# Patient Record
Sex: Male | Born: 1970 | Hispanic: Yes | Marital: Married | Smoking: Light tobacco smoker
Health system: Southern US, Community
[De-identification: ages and names within clinical notes are randomized; demographics above are authoritative.]

## PROBLEM LIST (undated history)

## (undated) DIAGNOSIS — I10 Essential (primary) hypertension: Secondary | ICD-10-CM

## (undated) DIAGNOSIS — E785 Hyperlipidemia, unspecified: Secondary | ICD-10-CM

## (undated) DIAGNOSIS — R7303 Prediabetes: Secondary | ICD-10-CM

## (undated) HISTORY — DX: Hyperlipidemia, unspecified: E78.5

## (undated) HISTORY — DX: Essential (primary) hypertension: I10

## (undated) HISTORY — DX: Prediabetes: R73.03

---

## 2015-07-17 ENCOUNTER — Encounter: Payer: Self-pay | Admitting: Internal Medicine

## 2015-07-17 ENCOUNTER — Ambulatory Visit (INDEPENDENT_AMBULATORY_CARE_PROVIDER_SITE_OTHER): Payer: Self-pay | Admitting: Internal Medicine

## 2015-07-17 VITALS — BP 168/102 | HR 87 | Temp 98.8°F | Resp 22 | Ht 69.0 in | Wt 232.0 lb

## 2015-07-17 DIAGNOSIS — E785 Hyperlipidemia, unspecified: Secondary | ICD-10-CM | POA: Insufficient documentation

## 2015-07-17 DIAGNOSIS — L089 Local infection of the skin and subcutaneous tissue, unspecified: Secondary | ICD-10-CM

## 2015-07-17 DIAGNOSIS — I10 Essential (primary) hypertension: Secondary | ICD-10-CM

## 2015-07-17 DIAGNOSIS — Z23 Encounter for immunization: Secondary | ICD-10-CM

## 2015-07-17 DIAGNOSIS — S61259A Open bite of unspecified finger without damage to nail, initial encounter: Secondary | ICD-10-CM

## 2015-07-17 DIAGNOSIS — W5501XA Bitten by cat, initial encounter: Secondary | ICD-10-CM

## 2015-07-17 MED ORDER — METOPROLOL TARTRATE 25 MG PO TABS: 25.0000 mg | ORAL_TABLET | Freq: Two times a day (BID) | ORAL | Status: DC

## 2015-07-17 MED ORDER — AMOXICILLIN-POT CLAVULANATE 875-125 MG PO TABS: 1.0000 | ORAL_TABLET | Freq: Two times a day (BID) | ORAL | Status: DC

## 2015-07-17 NOTE — Progress Notes (Addendum)
Subjective:    Patient ID: [facility] Todd, male    DOB: 1970/02/22, 45 y.o.   MRN: [id number]  HPI  New patient to establish His wife is Jerrye BeaversYolanda Tapia, a patient here.  1.  Cat bite to left hand:  States occurred 4 days ago.  Pt. Was walking into the back yard and startled a stray cat.  The cat was sitting on a lawn chair and tried to take off, but hind leg got caught in the chair.  The patient went to pull him out and the cat scratched him in the web between his thumb and index finger and bit him at the base of his left index finger. The cat is a stray around the [place] neighborhood and appears healthy.   He has not noted any frothing at the mouth previously.  They have not seen the cat around since the incident. About 4 hours after the bite, the pt. Started to note swelling.  STarted an antibiotic ointment he purchased at a tienda in [place] that contained Terramycin and Polymixin B.  The ointment has not made any difference,though he doesn't feel the swelling has worsened, though he has had worsening pain.  Has only been using the ointment once daily.  2.  Essential Hypertension:  History of taking unknown med for this about 5 years ago.  Took for 6 months and then stopped.    3.  History of Hyperlipidemia:  Was on medication for this as well about 5 years ago.  Cannot recall what med he took.   Meds:   Terramycin/Polymixin B ointment for skin.  No Known Allergies   Immunizations:  No history of Tdap in past 10 years    Review of Systems     Objective:   Physical Exam NAD HEENT:  PERRL, EOMI Neck:  Supple, no adenopathy Chest:  CTA CV:  RRR with normal S1 and S2, NO S3, S4 or murmur.  Radial and DP pulses normal and Equal LE:  No edema Left hand:  Swelling of dorsal and palmar hand in webbing between thumb and index finger.  Superficial scratch marks on dorsal hand as well.   Puncture marks on dorsum and medial side of index finger at base with most significant  swelling at base to PIP, tender at base of finger as well-mild.  No palpable fluctuance.  Minimal erythema surrounding puncture and scratch marks.  No increased warmth in area of swelling. Pt. Able to extend fingers, though flexion a bit decreased at index finger due to swelling.         Assessment & Plan:  1.  Cat Bite and Scratch of left hand with secondary infection:  Augmentin 875/125 mg twice daily for 10 days.  To follow up in 2 days.   Call for increased swelling, redness, pain, or fever. Warm water soak for 20 minutes about 30 minutes after taking Augmentin twice daily Call into [facility] to discuss need for Rabies prophylaxis--not sure of incidence in his area of the county. Discussed with patient to ask around and see if cat can be caught and observed. Received call from Darryl Lentammy Koontz at [facility].  Though cats and dogs are at low risk of rabies in [place], the cat is stray and has not been observed since the bite.  She recommends the patient [facility] Rabies [facility].   Patient notified of those recommendations, but has already started driving to [place] and will be back later tonight.  He will go into  the ED subsequently. Stated he tried to find the cat, but could not today.  2.  Essential Hypertension:  Start Metoprolol 25 mg twice daily.  Recheck BP in 2 days.  Discussed this is treatment not cure and needs to stay on meds, be physically active and get weight down.  3.  Hyperlipidemia:  Discussed healthy diet.  Will check fasting lipids in the future with follow up.

## 2015-07-19 ENCOUNTER — Ambulatory Visit (INDEPENDENT_AMBULATORY_CARE_PROVIDER_SITE_OTHER): Payer: Self-pay | Admitting: Internal Medicine

## 2015-07-19 ENCOUNTER — Encounter: Payer: Self-pay | Admitting: Internal Medicine

## 2015-07-19 VITALS — BP 128/70 | HR 75 | Resp 22 | Ht 69.0 in | Wt 243.0 lb

## 2015-07-19 DIAGNOSIS — W5501XD Bitten by cat, subsequent encounter: Principal | ICD-10-CM

## 2015-07-19 DIAGNOSIS — L089 Local infection of the skin and subcutaneous tissue, unspecified: Secondary | ICD-10-CM

## 2015-07-19 DIAGNOSIS — S61259D Open bite of unspecified finger without damage to nail, subsequent encounter: Secondary | ICD-10-CM

## 2015-07-19 NOTE — Progress Notes (Signed)
   Subjective:    Patient ID: [facility] Todd, male    DOB: 02-Sep-1970, 45 y.o.   MRN: [id number]  HPI   1.  Left hand infection following cat bite and scratch:   He did not go to ED as recommended for rabies vaccine.  He has seen the cat --last time was yesterday, and appeared healthy.  He has not called animal control. Left hand, in the meantime, is much improved.  Started the Augmentin the afternoon he was seen 2 days ago.  No problems with medication.   Current outpatient prescriptions:  .  amoxicillin-clavulanate (AUGMENTIN) 875-125 MG tablet, Take 1 tablet by mouth 2 (two) times daily., Disp: 20 tablet, Rfl: 0 .  metoprolol tartrate (LOPRESSOR) 25 MG tablet, Take 1 tablet (25 mg total) by mouth 2 (two) times daily., Disp: 60 tablet, Rfl: 11   No Known Allergies    Review of Systems     Objective:   Physical Exam  Significant decrease in swelling of index finger and 1st web of left hand.  Some stiffness in making fist with proximal left index finger      Assessment & Plan:  1.  Infected Cat bite and scratch of left hand:  Hand looks much better. To finish 10 day course.  Accidentally filled 2 prescriptions of Augmentin, so has total of 20 days now.  Discussed he could continue the antibiotic longer if still with discomfort at day 10, but to call on July 3 and let us know if still with discomfort. Called Animal Control who will attempt to catch the cat for the remaining 4 days of observation.

## 2015-07-26 ENCOUNTER — Other Ambulatory Visit: Payer: Self-pay | Admitting: Family Medicine

## 2015-07-26 ENCOUNTER — Ambulatory Visit (INDEPENDENT_AMBULATORY_CARE_PROVIDER_SITE_OTHER): Payer: Self-pay | Admitting: Family Medicine

## 2015-07-26 ENCOUNTER — Encounter: Payer: Self-pay | Admitting: Family Medicine

## 2015-07-26 VITALS — BP 118/70 | HR 58 | Temp 98.2°F | Resp 20 | Ht 69.0 in | Wt 244.0 lb

## 2015-07-26 DIAGNOSIS — N5089 Other specified disorders of the male genital organs: Secondary | ICD-10-CM | POA: Insufficient documentation

## 2015-07-26 DIAGNOSIS — N509 Disorder of male genital organs, unspecified: Secondary | ICD-10-CM

## 2015-07-26 NOTE — Progress Notes (Signed)
CC: Swelling on scrotum  HPI: This is a 45 year old man with past medical history of HTN, hyperlipidemia and smoking who presents with >6 months testicular swelling.  About a year ago, the patient started to notice swelling in the scrotum, "like a swollen vein, sort of", hard to describe.  Gradually he felt that the RIGHT testicle just "became harder and larger."  This was painless, did not spontaneously reduce at night or with reclining.  He did notice that with prolonged sitting, sometimes he developed tingling "like they're asleep" which resolved with standing.  No dysuria, penile discharge, rash.    ROS: No weight loss, redness or inflammation of the testicle.    PFH: Father, bone cancer.  No family history of hernia, other issues of the testicles that he knows of, testicular cancer.  SH: He smokes.  He works as a Scientist, forensiccontractor putting in tile.  PMH: Metoprolol 25 mg BID  NKDA  Objective: BP 118/70 mmHg  Pulse 58  Temp(Src) 98.2 F (36.8 C)  Resp 20  Ht 5\' 9"  (1.753 m)  Wt 244 lb (110.678 kg)  BMI 36.02 kg/m2  Gen: Well appearing, obese adult male, no acute distress, conversational. Cor: RRR, no M/r/g.  Nl S1-S2. Resp: CTAB, normal respiratory effort. Lymph: No groin adenopathy palpated. GI: No groin bulge, the mass does not reduce with lying down. GU: The penis is normal.  The scrotum is generally enlarged, and the RIGHT testicle feels to me >60cc at least and firm.  The left testicle is hard to palpate, given overall swelling in the scrotum.  The mass does not move with cough, valsalva.        A&P: 1. Scrotal swelling: Ddx includes hernia, malignancy (hopefully not, he is on the older side for this, but is a smoker), hydrocele, varicocele.  Will image to rule out malignancy. -US scrotum        Alberteen SamChristopher P Korina Tretter 07/26/2015 9:09 AM

## 2015-07-26 NOTE — Patient Instructions (Addendum)
Your appointment for ultrasound is at 12:40PM on August 02, 2015 at [facility], [address]

## 2015-08-02 ENCOUNTER — Ambulatory Visit
Admission: RE | Admit: 2015-08-02 | Discharge: 2015-08-02 | Disposition: A | Discharge status: Home / Self Care | Payer: No Typology Code available for payment source | Source: Ambulatory Visit | Attending: Family Medicine | Admitting: Family Medicine

## 2015-08-08 NOTE — Progress Notes (Signed)
OV scheduled for 08/09/15 @ 9:30AM

## 2015-08-08 NOTE — Progress Notes (Signed)
Patient OV scheduled for 08/09/15 @ 9:30AM

## 2015-08-08 NOTE — Progress Notes (Signed)
OV scheduled for 08/09/15 @ 9:30AM °

## 2015-08-09 ENCOUNTER — Ambulatory Visit (INDEPENDENT_AMBULATORY_CARE_PROVIDER_SITE_OTHER): Payer: Self-pay | Admitting: Internal Medicine

## 2015-08-09 ENCOUNTER — Encounter: Payer: Self-pay | Admitting: Internal Medicine

## 2015-08-09 VITALS — BP 128/80 | HR 60 | Resp 26 | Ht 69.5 in | Wt 244.0 lb

## 2015-08-09 DIAGNOSIS — N433 Hydrocele, unspecified: Secondary | ICD-10-CM

## 2015-08-09 NOTE — Patient Instructions (Signed)
Call if you do not hear about a Urology appointment in 1 month

## 2015-08-09 NOTE — Progress Notes (Signed)
   Subjective:    Patient ID: [facility], male    DOB: [birth year], 45 y.o.   MRN: [id number]  HPI   Here today to discuss large right hydrocele and left varicocele.  See ultrasound report below.  Not looking to have more children. Discussed these are benign processes, however, due to the size of the right hydrocele, would recommend at least speaking with Urology about cost, time off required to have this surgically addressed. He is uncomfortable with the right hydrocele due to its size.   CLINICAL DATA: 45 year old male with right scrotal lump palpated for 1 year. No acute symptoms.  EXAM: SCROTAL ULTRASOUND  DOPPLER ULTRASOUND OF THE TESTICLES  TECHNIQUE: Complete ultrasound examination of the testicles, epididymis, and other scrotal structures was performed. Color and spectral Doppler ultrasound were also utilized to evaluate blood flow to the testicles.  COMPARISON: None.  FINDINGS: Right testicle  Measurements: 4.1 x 3.6 x 3.6 cm. No mass or microlithiasis visualized.  Left testicle  Measurements: 4.0 x 3.1 x 3.0 cm. No mass or microlithiasis visualized.  Right epididymis: Not discretely visualized.  Left epididymis: Normal in size and appearance.  Hydrocele: Large simple right hydrocele measuring 13.5 x 6.1 x 9.6 cm in maximum dimensions. No left hydrocele.  Varicocele: Small-to-moderate left varicocele with left scrotal veins measuring up to 4 mm diameter. Tiny calcified 3 mm venous phlebolith is seen within one of the dilated left inferior scrotal veins. No right varicocele.  Pulsed Doppler interrogation of both testes demonstrates normal low resistance arterial and venous waveforms bilaterally.  IMPRESSION: 1. Large simple right hydrocele. 2. Normal testes with no evidence of testicular torsion. No testicular mass. 3. Small to moderate left varicocele.   Electronically Signed  By: Delbert PhenixJason A Poff M.D. Review of Systems    Objective:   Physical Exam  See U/S exam above     Assessment & Plan:  Large Right Hydrocele:  Had patient and wife come in today to adequately discuss through interpretation face to face.  He would like to proceed with referral to Urology to see what is recommended, the cost and time off required to have this surgically addressed.

## 2016-04-16 ENCOUNTER — Other Ambulatory Visit: Payer: Self-pay | Admitting: Urology

## 2016-04-16 ENCOUNTER — Encounter (HOSPITAL_BASED_OUTPATIENT_CLINIC_OR_DEPARTMENT_OTHER): Payer: Self-pay | Admitting: *Deleted

## 2016-04-16 NOTE — Progress Notes (Signed)
Spoke with wife via [employer].She stated he had stopped his metoprolol several months ago without MD advice-explained he needs to restart his medications-have pharmacy contact Dr Alden BenjaminMullberry for refill today-Instructions to arrive at 1030-Istat,Ekg on arrival-will provide an interpreter for them.Npo after Mn-refrain from smoking also-take his metoprolol with water that morning.She stated understood information discussed.

## 2016-04-17 ENCOUNTER — Encounter: Payer: Self-pay | Admitting: Internal Medicine

## 2016-04-17 ENCOUNTER — Ambulatory Visit (INDEPENDENT_AMBULATORY_CARE_PROVIDER_SITE_OTHER): Payer: Self-pay | Admitting: Internal Medicine

## 2016-04-17 VITALS — BP 142/92 | HR 72 | Resp 14 | Ht 68.5 in | Wt 241.0 lb

## 2016-04-17 DIAGNOSIS — Z720 Tobacco use: Secondary | ICD-10-CM

## 2016-04-17 DIAGNOSIS — E669 Obesity, unspecified: Secondary | ICD-10-CM | POA: Insufficient documentation

## 2016-04-17 DIAGNOSIS — I10 Essential (primary) hypertension: Secondary | ICD-10-CM

## 2016-04-17 DIAGNOSIS — F1010 Alcohol abuse, uncomplicated: Secondary | ICD-10-CM

## 2016-04-17 DIAGNOSIS — Z6836 Body mass index (BMI) 36.0-36.9, adult: Secondary | ICD-10-CM

## 2016-04-17 DIAGNOSIS — E6609 Other obesity due to excess calories: Secondary | ICD-10-CM

## 2016-04-17 DIAGNOSIS — IMO0001 Reserved for inherently not codable concepts without codable children: Secondary | ICD-10-CM

## 2016-04-17 MED ORDER — NICOTINE 21 MG/24HR TD PT24: 21.0000 mg | MEDICATED_PATCH | Freq: Every day | TRANSDERMAL | 0 refills | Status: DC

## 2016-04-17 MED ORDER — NICOTINE 14 MG/24HR TD PT24: 14.0000 mg | MEDICATED_PATCH | Freq: Every day | TRANSDERMAL | 0 refills | Status: DC

## 2016-04-17 MED ORDER — NICOTINE 7 MG/24HR TD PT24: 7.0000 mg | MEDICATED_PATCH | Freq: Every day | TRANSDERMAL | 0 refills | Status: DC

## 2016-04-17 MED ORDER — AMLODIPINE BESYLATE 5 MG PO TABS: 5.0000 mg | ORAL_TABLET | Freq: Every day | ORAL | 11 refills | Status: DC

## 2016-04-17 NOTE — Progress Notes (Signed)
Subjective:    Patient ID: Hunter Todd, male    DOB: 08/15/1970, 46 y.o.   MRN: [id number]  HPI   Here for medical clearance for right Hydrocelectomy.   Reportedly, undergoing general anesthesia per both patient and wife.    Apparent concern was for history of essential hypertension for which he was prescribed Metoprolol 25 mg twice daily here last June.   He had done this previously before establishing with our clinic. Stopped Metoprolol unbeknownst to [place] probably back in August as he felt it caused him to cough and be short of breath.    Denies chest pain or dyspnea with exertion, though at times can breath faster with heavy work, lifting 50 lbs regularly throughout his work day. No swelling of ankles. Denies PND or orthopnea symptoms.    Snores with sleep.  His wife states there are times when he pauses with breathing.   No outpatient prescriptions have been marked as taking for the 04/17/16 encounter (Office Visit) with Julieanne Manson, MD.    No Known Allergies   Past Medical History:  Diagnosis Date  . Hyperlipidemia   . Hypertension    stopped without MD approval 4-5 mths ago per wife   History reviewed. No pertinent surgical history.   Family History  Problem Relation Age of Onset  . Cancer Father     Of bone or metastasized to bone    Social History   Social History  . Marital status: Married    Spouse name: N/A  . Number of children: N/A  . Years of education: N/A   Occupational History  . Not on file.   Social History Main Topics  . Smoking status: Current Every Day Smoker    Packs/day: 1.00    Years: 28.00    Types: Cigarettes    Start date: 05/28/1990  . Smokeless tobacco: Never Used  . Alcohol use 26.4 - 47.4 oz/week    9 Standard drinks or equivalent, 35 - 70 Cans of beer per week     Comment: Drinks 5-10 beers daily.  . Drug use: No  . Sexual activity: Not Currently   Other Topics Concern  . Not on file   Social History  Narrative  . No narrative on file         Review of Systems     Objective:   Physical Exam  Obese, NAD HEENT:  PERRL, EOMI, TMs pearly gray, throat without injection Neck:  Supple, No adenopathy, no thyromeglay Chest: CTA CV:  RRR with normal S1 and S2, No S3, S4 or murmur.  No carotid bruits.  Carotid, radial, DP and PT pulses normal and equal Abd:  S, NT, No HSM or mass, + BS LE:  No edema  ECG with NSR and no changes to suggest ischemia at this time.      Assessment & Plan:  1.  Essential Hypertension:  Mildly elevated today.  Stop alcohol, which he states he can.   Start Amlodipine 5 mg daily at night so he will be able to take the night before his morning surgery. BP check with nursing next Monday. No evidence of heart disease and is very physically active.  2.  Obesity:  Return tomorrow for fasting labs:  CMP, CBC, FLP.  3.  Tobacco abuse:  Stop smoking at least for surgery.  Nicotine patches with good Rx coupons makes this affordable.  21 mg for 28 days, then 14 mg for 14 days, then 7 mg of 14  days.  4.  Alcohol Abuse:  Discussed this likely causing elevated bp as well as obesity.  He has never had symptoms of withdrawal when he stops for a time.    5. Likely Obstructive Sleep Apnea, but unlikely to afford work up and equipment.  Discussed need for weight loss.

## 2016-04-17 NOTE — Patient Instructions (Signed)
Drink a glass of water before every meal Drink 6-8 glasses of water daily Eat three meals daily Eat a protein and healthy fat with every meal (eggs,fish, chicken, [place]key and limit red meats) Eat 5 servings of vegetables daily, mix the colors Eat 2 servings of fruit daily with skin, if skin is edible Use smaller plates Put food/utensils down as you chew and swallow each bite Eat at a table with friends/family at least once daily, no TV Do not eat in front of the TV  Tobacco Cessation:   1800QUITNOW or [phone], the former for support and possibly free nicotine patches/gum and support; the latter for [facility] Smoking cessation class. Get rid of all smoking supplies:  Cigarettes, lighters, ashtrays--no stashes just in case at home if you are serious.  For nicotine patches:  Stop smoking anything the day you start the first patch Start with 21 mg patch and reapply new to different area of skin every 24 hours for 30 days. Then 14 mg patch changed every 24 hours for 14 days. Then 7 mg patch changed every 24 hours for 14 days.

## 2016-04-18 ENCOUNTER — Other Ambulatory Visit (INDEPENDENT_AMBULATORY_CARE_PROVIDER_SITE_OTHER): Payer: Self-pay

## 2016-04-18 DIAGNOSIS — IMO0001 Reserved for inherently not codable concepts without codable children: Secondary | ICD-10-CM

## 2016-04-18 DIAGNOSIS — E785 Hyperlipidemia, unspecified: Secondary | ICD-10-CM

## 2016-04-19 LAB — CBC WITH DIFFERENTIAL/PLATELET
BASOS: 0 %
Basophils Absolute: 0 10*3/uL (ref 0.0–0.2)
EOS (ABSOLUTE): 0.3 10*3/uL (ref 0.0–0.4)
EOS: 4 %
Hematocrit: 47.6 % (ref 37.5–51.0)
Hemoglobin: 16 g/dL (ref 13.0–17.7)
IMMATURE GRANS (ABS): 0.1 10*3/uL (ref 0.0–0.1)
IMMATURE GRANULOCYTES: 1 %
LYMPHS: 38 %
Lymphocytes Absolute: 3.4 10*3/uL — ABNORMAL HIGH (ref 0.7–3.1)
MCH: 31.6 pg (ref 26.6–33.0)
MCHC: 33.6 g/dL (ref 31.5–35.7)
MCV: 94 fL (ref 79–97)
MONOCYTES: 7 %
MONOS ABS: 0.6 10*3/uL (ref 0.1–0.9)
NEUTROS PCT: 50 %
Neutrophils Absolute: 4.5 10*3/uL (ref 1.4–7.0)
PLATELETS: 138 10*3/uL — AB (ref 150–379)
RBC: 5.06 x10E6/uL (ref 4.14–5.80)
RDW: 13.7 % (ref 12.3–15.4)
WBC: 8.9 10*3/uL (ref 3.4–10.8)

## 2016-04-19 LAB — COMPREHENSIVE METABOLIC PANEL
ALK PHOS: 103 IU/L (ref 39–117)
ALT: 51 IU/L — AB (ref 0–44)
AST: 42 IU/L — AB (ref 0–40)
Albumin/Globulin Ratio: 1.7 (ref 1.2–2.2)
Albumin: 4.3 g/dL (ref 3.5–5.5)
BUN/Creatinine Ratio: 16 (ref 9–20)
BUN: 14 mg/dL (ref 6–24)
Bilirubin Total: 0.4 mg/dL (ref 0.0–1.2)
CALCIUM: 9.3 mg/dL (ref 8.7–10.2)
CO2: 27 mmol/L (ref 18–29)
CREATININE: 0.86 mg/dL (ref 0.76–1.27)
Chloride: 100 mmol/L (ref 96–106)
GFR calc Af Amer: 123 mL/min/{1.73_m2} (ref >59)
GFR, EST NON AFRICAN AMERICAN: 106 mL/min/{1.73_m2} (ref >59)
GLOBULIN, TOTAL: 2.5 g/dL (ref 1.5–4.5)
GLUCOSE: 123 mg/dL — AB (ref 65–99)
Potassium: 5 mmol/L (ref 3.5–5.2)
SODIUM: 141 mmol/L (ref 134–144)
Total Protein: 6.8 g/dL (ref 6.0–8.5)

## 2016-04-19 LAB — LIPID PANEL W/O CHOL/HDL RATIO
Cholesterol, Total: 255 mg/dL — ABNORMAL HIGH (ref 100–199)
HDL: 37 mg/dL — AB (ref >39)
LDL Calculated: 165 mg/dL — ABNORMAL HIGH (ref 0–99)
Triglycerides: 264 mg/dL — ABNORMAL HIGH (ref 0–149)
VLDL Cholesterol Cal: 53 mg/dL — ABNORMAL HIGH (ref 5–40)

## 2016-04-21 ENCOUNTER — Ambulatory Visit (INDEPENDENT_AMBULATORY_CARE_PROVIDER_SITE_OTHER): Payer: Self-pay

## 2016-04-21 VITALS — BP 134/84 | HR 80

## 2016-04-21 DIAGNOSIS — I10 Essential (primary) hypertension: Secondary | ICD-10-CM

## 2016-04-23 ENCOUNTER — Ambulatory Visit (HOSPITAL_BASED_OUTPATIENT_CLINIC_OR_DEPARTMENT_OTHER): Admission: RE | Admit: 2016-04-23 | Payer: Self-pay | Source: Ambulatory Visit | Admitting: Urology

## 2016-04-23 SURGERY — HYDROCELECTOMY
Anesthesia: General | Laterality: Right

## 2016-04-25 LAB — HGB A1C W/O EAG: HEMOGLOBIN A1C: 6 % — AB (ref 4.8–5.6)

## 2016-04-25 LAB — SPECIMEN STATUS REPORT

## 2016-05-01 ENCOUNTER — Ambulatory Visit (INDEPENDENT_AMBULATORY_CARE_PROVIDER_SITE_OTHER): Payer: Self-pay | Admitting: Internal Medicine

## 2016-05-01 ENCOUNTER — Encounter: Payer: Self-pay | Admitting: Internal Medicine

## 2016-05-01 VITALS — BP 132/88 | HR 72 | Resp 12 | Ht 68.5 in | Wt 245.0 lb

## 2016-05-01 DIAGNOSIS — F1010 Alcohol abuse, uncomplicated: Secondary | ICD-10-CM

## 2016-05-01 DIAGNOSIS — Z720 Tobacco use: Secondary | ICD-10-CM

## 2016-05-01 DIAGNOSIS — R7303 Prediabetes: Secondary | ICD-10-CM

## 2016-05-01 DIAGNOSIS — IMO0001 Reserved for inherently not codable concepts without codable children: Secondary | ICD-10-CM

## 2016-05-01 DIAGNOSIS — E6609 Other obesity due to excess calories: Secondary | ICD-10-CM

## 2016-05-01 DIAGNOSIS — I10 Essential (primary) hypertension: Secondary | ICD-10-CM

## 2016-05-01 DIAGNOSIS — R0683 Snoring: Secondary | ICD-10-CM

## 2016-05-01 DIAGNOSIS — Z6836 Body mass index (BMI) 36.0-36.9, adult: Secondary | ICD-10-CM

## 2016-05-01 DIAGNOSIS — E785 Hyperlipidemia, unspecified: Secondary | ICD-10-CM

## 2016-05-01 HISTORY — DX: Prediabetes: R73.03

## 2016-05-01 NOTE — Patient Instructions (Signed)

## 2016-05-01 NOTE — Progress Notes (Signed)
   Subjective:    Patient ID: Hunter Todd, male    DOB: November 13, 1970, 46 y.o.   MRN: [id number]  HPI   Here to follow up on health issues to improve before considering surgery.  1.  Smoking:  Stopped without nicotine replacement since last visit.  He is struggling a bit, but states he does not want the nicotine patch support.  2.  Prediabetes:  A1C was 6.0% when checked about 1 week ago.  Blood sugar fasting was elevated at 123.   Discussed Type 1 and 2 DM and possible complications if not controlled well.   Discussed cutting back on alcohol  3.  Hyperlipidemia:  Discussed elevated as would expect Lipid Panel     Component Value Date/Time   CHOL 255 (H) 04/18/2016 0901   TRIG 264 (H) 04/18/2016 0901   HDL 37 (L) 04/18/2016 0901   LDLCALC 165 (H) 04/18/2016 0901    4.  Alcohol abuse:  Has cut out all alcohol since last visit.  Mild agitation with quitting both tobacco and alcohol, but doing okay.  5.  Essential Hypertension:  Taking Amlodipine without problems.  BP has been at goal since starting.  Again, denies chest pain with heavy work.  ECG was fine last visit. Does have symptoms highly concerning for sleep apnea.  Heavy snoring and stops breathing.  Current Meds  Medication Sig  . amLODipine (NORVASC) 5 MG tablet Take 1 tablet (5 mg total) by mouth daily.    No Known Allergies      Review of Systems     Objective:   Physical Exam  NAD Lungs:  CTA CV:  RRR with normal S1 and S2, No S3, S4 or murmur, radial and DP pulses normal and equal Abd: S, NT, No HSM or mass, + BS LE:  No edema       Assessment & Plan:  1.  Hyperlipidemia:  Long discussion regarding lifestyle changes with wife in room, who does most of cooking in home.  Also rediscussed finding time to be physically active with something he enjoys on a daily basis.  2.  Prediabetes:  As above.  3.  Tobacco and Alcohol abuse:  Has discontinued both since last visit.  Encouraged to maintain this.   He does not want any support.  4.  Essential Hypertension:  Controlled with current regimen.  5.  Obesity and likely sleep apnea  Will continue to work on lifestyle changes and can discuss the possibility of a sleep study/cost, etc.  Following surgery

## 2016-05-07 ENCOUNTER — Telehealth: Payer: Self-pay | Admitting: Internal Medicine

## 2016-05-07 NOTE — Telephone Encounter (Signed)
Patient informed. 

## 2016-05-12 ENCOUNTER — Other Ambulatory Visit: Payer: Self-pay | Admitting: Urology

## 2016-05-23 NOTE — Patient Instructions (Signed)
Hunter Todd  05/23/2016   Your procedure is scheduled on: 05-29-16  Report to [facility]  T[facility] Take [facility]  elevators to 3rd floor to  [facility] at 530AM.   Call this number if you have problems the morning of surgery [phone]    Remember: ONLY 1 PERSON MAY GO WITH YOU TO [facility] TO GET  READY MORNING OF YOUR SURGERY.  Do not eat food or drink liquids :After Midnight.     Take these medicines the morning of surgery with A SIP OF WATER: none                                You may not have any metal on your body including hair pins and              piercings  Do not wear jewelry, make-up, lotions, powders or perfumes, deodorant                    Men may shave face and neck.   Do not bring valuables to the hospital. [name] IS NOT             RESPONSIBLE   FOR VALUABLES.  Contacts, dentures or bridgework may not be worn into surgery.      Patients discharged the day of surgery will not be allowed to drive home.  Name and phone number of your driver:  Special Instructions: N/A              Please read over the following fact sheets you were given: _____________________________________________________________________             [facility] - Preparing for Surgery Before surgery, you can play an important role.  Because skin is not sterile, your skin needs to be as free of germs as possible.  You can reduce the number of germs on your skin by washing with CHG (chlorahexidine gluconate) soap before surgery.  CHG is an antiseptic cleaner which kills germs and bonds with the skin to continue killing germs even after washing. Please DO NOT use if you have an allergy to CHG or antibacterial soaps.  If your skin becomes reddened/irritated stop using the CHG and inform your nurse when you arrive at [facility]. Do not shave (including legs and underarms) for at least 48 hours prior to the first CHG shower.  You may shave your  face/neck. Please follow these instructions carefully:  1.  Shower with CHG Soap the night before surgery and the  morning of Surgery.  2.  If you choose to wash your hair, wash your hair first as usual with your  normal  shampoo.  3.  After you shampoo, rinse your hair and body thoroughly to remove the  shampoo.                           4.  Use CHG as you would any other liquid soap.  You can apply chg directly  to the skin and wash                       Gently with a scrungie or clean washcloth.  5.  Apply the CHG Soap to your body ONLY FROM THE NECK DOWN.  Do not use on face/ open                           Wound or open sores. Avoid contact with eyes, ears mouth and genitals (private parts).                       Wash face,  Genitals (private parts) with your normal soap.             6.  Wash thoroughly, paying special attention to the area where your surgery  will be performed.  7.  Thoroughly rinse your body with warm water from the neck down.  8.  DO NOT shower/wash with your normal soap after using and rinsing off  the CHG Soap.                9.  Pat yourself dry with a clean towel.            10.  Wear clean pajamas.            11.  Place clean sheets on your bed the night of your first shower and do not  sleep with pets. Day of Surgery : Do not apply any lotions/deodorants the morning of surgery.  Please wear clean clothes to the hospital/surgery Todd.  FAILURE TO FOLLOW THESE INSTRUCTIONS MAY RESULT IN THE CANCELLATION OF YOUR SURGERY PATIENT SIGNATURE_________________________________  NURSE SIGNATURE__________________________________  ________________________________________________________________________

## 2016-05-23 NOTE — Progress Notes (Signed)
Clearance Mulberry 05-07-16 epic LOV Mulberry 05-01-16 epic EKG 04-17-16 epic HgA1C  04-18-16 epic

## 2016-05-26 ENCOUNTER — Encounter (HOSPITAL_COMMUNITY)
Admission: RE | Admit: 2016-05-26 | Discharge: 2016-05-26 | Disposition: A | Discharge status: Home / Self Care | Payer: Self-pay | Source: Ambulatory Visit | Attending: Urology | Admitting: Urology

## 2016-05-26 ENCOUNTER — Encounter (HOSPITAL_COMMUNITY): Payer: Self-pay

## 2016-05-26 DIAGNOSIS — Z01812 Encounter for preprocedural laboratory examination: Secondary | ICD-10-CM | POA: Insufficient documentation

## 2016-05-26 DIAGNOSIS — N433 Hydrocele, unspecified: Secondary | ICD-10-CM | POA: Insufficient documentation

## 2016-05-26 LAB — CBC
HEMATOCRIT: 45.4 % (ref 39.0–52.0)
HEMOGLOBIN: 15.9 g/dL (ref 13.0–17.0)
MCH: 31.9 pg (ref 26.0–34.0)
MCHC: 35 g/dL (ref 30.0–36.0)
MCV: 91.2 fL (ref 78.0–100.0)
Platelets: 131 10*3/uL — ABNORMAL LOW (ref 150–400)
RBC: 4.98 MIL/uL (ref 4.22–5.81)
RDW: 12.4 % (ref 11.5–15.5)
WBC: 8.1 10*3/uL (ref 4.0–10.5)

## 2016-05-26 LAB — BASIC METABOLIC PANEL
ANION GAP: 7 (ref 5–15)
BUN: 15 mg/dL (ref 6–20)
CALCIUM: 9.1 mg/dL (ref 8.9–10.3)
CO2: 26 mmol/L (ref 22–32)
Chloride: 104 mmol/L (ref 101–111)
Creatinine, Ser: 0.84 mg/dL (ref 0.61–1.24)
GFR calc non Af Amer: >60 mL/min (ref >60)
GLUCOSE: 134 mg/dL — AB (ref 65–99)
POTASSIUM: 4 mmol/L (ref 3.5–5.1)
Sodium: 137 mmol/L (ref 135–145)

## 2016-05-28 ENCOUNTER — Ambulatory Visit: Payer: Self-pay | Attending: Internal Medicine

## 2016-05-28 NOTE — Anesthesia Preprocedure Evaluation (Signed)
Anesthesia Evaluation  Patient identified by MRN, date of birth, ID band Patient awake    Reviewed: Allergy & Precautions, NPO status , Patient's Chart, lab work & pertinent test results  Airway Mallampati: II  TM Distance: >3 FB Neck ROM: Full    Dental no notable dental hx.    Pulmonary neg pulmonary ROS, former smoker,    Pulmonary exam normal breath sounds clear to auscultation       Cardiovascular hypertension, Pt. on medications Normal cardiovascular exam Rhythm:Regular Rate:Normal     Neuro/Psych negative neurological ROS  negative psych ROS   GI/Hepatic negative GI ROS, Neg liver ROS,   Endo/Other  negative endocrine ROS  Renal/GU negative Renal ROS     Musculoskeletal negative musculoskeletal ROS (+)   Abdominal   Peds  Hematology negative hematology ROS (+)   Anesthesia Other Findings   Reproductive/Obstetrics                             Anesthesia Physical Anesthesia Plan  ASA: II  Anesthesia Plan: General   Post-op Pain Management:    Induction: Intravenous  Airway Management Planned: LMA  Additional Equipment:   Intra-op Plan:   Post-operative Plan: Extubation in OR  Informed Consent: I have reviewed the patients History and Physical, chart, labs and discussed the procedure including the risks, benefits and alternatives for the proposed anesthesia with the patient or authorized representative who has indicated his/her understanding and acceptance.   Dental advisory given  Plan Discussed with: CRNA  Anesthesia Plan Comments:         Anesthesia Quick Evaluation

## 2016-05-29 ENCOUNTER — Encounter (HOSPITAL_COMMUNITY): Payer: Self-pay | Admitting: *Deleted

## 2016-05-29 ENCOUNTER — Encounter (HOSPITAL_COMMUNITY)
Admission: RE | Disposition: A | Discharge status: Home / Self Care | Payer: Self-pay | Source: Ambulatory Visit | Attending: Urology

## 2016-05-29 ENCOUNTER — Ambulatory Visit (HOSPITAL_COMMUNITY)
Admission: RE | Admit: 2016-05-29 | Discharge: 2016-05-29 | Disposition: A | Discharge status: Home / Self Care | Payer: Self-pay | Source: Ambulatory Visit | Attending: Urology | Admitting: Urology

## 2016-05-29 ENCOUNTER — Ambulatory Visit (HOSPITAL_COMMUNITY): Payer: Self-pay | Admitting: Anesthesiology

## 2016-05-29 DIAGNOSIS — Z87891 Personal history of nicotine dependence: Secondary | ICD-10-CM | POA: Insufficient documentation

## 2016-05-29 DIAGNOSIS — N433 Hydrocele, unspecified: Secondary | ICD-10-CM | POA: Insufficient documentation

## 2016-05-29 DIAGNOSIS — I10 Essential (primary) hypertension: Secondary | ICD-10-CM | POA: Insufficient documentation

## 2016-05-29 DIAGNOSIS — Z79899 Other long term (current) drug therapy: Secondary | ICD-10-CM | POA: Insufficient documentation

## 2016-05-29 DIAGNOSIS — E785 Hyperlipidemia, unspecified: Secondary | ICD-10-CM | POA: Insufficient documentation

## 2016-05-29 DIAGNOSIS — E7800 Pure hypercholesterolemia, unspecified: Secondary | ICD-10-CM | POA: Insufficient documentation

## 2016-05-29 DIAGNOSIS — Z808 Family history of malignant neoplasm of other organs or systems: Secondary | ICD-10-CM | POA: Insufficient documentation

## 2016-05-29 HISTORY — PX: HYDROCELE EXCISION: SHX482

## 2016-05-29 SURGERY — HYDROCELECTOMY
Anesthesia: General | Laterality: Right

## 2016-05-29 MED ORDER — OXYCODONE HCL 5 MG/5ML PO SOLN: 5.0000 mg | Freq: Once | ORAL | Status: AC | PRN

## 2016-05-29 MED ORDER — PROPOFOL 10 MG/ML IV BOLUS
INTRAVENOUS | Status: DC | PRN
  Administered 2016-05-29: 200 mg via INTRAVENOUS

## 2016-05-29 MED ORDER — BUPIVACAINE-EPINEPHRINE (PF) 0.5% -1:200000 IJ SOLN
INTRAMUSCULAR | Status: AC
  Filled 2016-05-29: qty 30

## 2016-05-29 MED ORDER — LIDOCAINE 2% (20 MG/ML) 5 ML SYRINGE
INTRAMUSCULAR | Status: AC
  Filled 2016-05-29: qty 5

## 2016-05-29 MED ORDER — ONDANSETRON HCL 4 MG/2ML IJ SOLN
INTRAMUSCULAR | Status: AC
  Filled 2016-05-29: qty 2

## 2016-05-29 MED ORDER — FENTANYL CITRATE (PF) 100 MCG/2ML IJ SOLN
INTRAMUSCULAR | Status: DC | PRN
  Administered 2016-05-29 (×4): 50 ug via INTRAVENOUS

## 2016-05-29 MED ORDER — CEFAZOLIN SODIUM-DEXTROSE 2-4 GM/100ML-% IV SOLN
INTRAVENOUS | Status: AC
  Filled 2016-05-29: qty 100

## 2016-05-29 MED ORDER — BUPIVACAINE HCL (PF) 0.25 % IJ SOLN
INTRAMUSCULAR | Status: AC
  Filled 2016-05-29: qty 30

## 2016-05-29 MED ORDER — DEXAMETHASONE SODIUM PHOSPHATE 10 MG/ML IJ SOLN
INTRAMUSCULAR | Status: DC | PRN
  Administered 2016-05-29: 10 mg via INTRAVENOUS

## 2016-05-29 MED ORDER — MIDAZOLAM HCL 5 MG/5ML IJ SOLN
INTRAMUSCULAR | Status: DC | PRN
  Administered 2016-05-29: 2 mg via INTRAVENOUS

## 2016-05-29 MED ORDER — FENTANYL CITRATE (PF) 100 MCG/2ML IJ SOLN
INTRAMUSCULAR | Status: AC
  Filled 2016-05-29: qty 2

## 2016-05-29 MED ORDER — DEXAMETHASONE SODIUM PHOSPHATE 10 MG/ML IJ SOLN
INTRAMUSCULAR | Status: AC
  Filled 2016-05-29: qty 1

## 2016-05-29 MED ORDER — HYDROMORPHONE HCL 1 MG/ML IJ SOLN
INTRAMUSCULAR | Status: AC
  Filled 2016-05-29: qty 1

## 2016-05-29 MED ORDER — PROMETHAZINE HCL 25 MG/ML IJ SOLN: 6.2500 mg | INTRAMUSCULAR | Status: DC | PRN

## 2016-05-29 MED ORDER — MEPERIDINE HCL 50 MG/ML IJ SOLN: 6.2500 mg | INTRAMUSCULAR | Status: DC | PRN

## 2016-05-29 MED ORDER — LACTATED RINGERS IV SOLN
INTRAVENOUS | Status: DC | PRN
  Administered 2016-05-29: 07:00:00 via INTRAVENOUS
  Administered 2016-05-29: 1000 mL

## 2016-05-29 MED ORDER — PROPOFOL 10 MG/ML IV BOLUS
INTRAVENOUS | Status: AC
  Filled 2016-05-29: qty 20

## 2016-05-29 MED ORDER — HYDROMORPHONE HCL 1 MG/ML IJ SOLN
0.2500 mg | INTRAMUSCULAR | Status: DC | PRN
  Administered 2016-05-29 (×3): 0.5 mg via INTRAVENOUS

## 2016-05-29 MED ORDER — CEFAZOLIN SODIUM-DEXTROSE 2-4 GM/100ML-% IV SOLN
2.0000 g | INTRAVENOUS | Status: AC
  Administered 2016-05-29: 2 g via INTRAVENOUS

## 2016-05-29 MED ORDER — MIDAZOLAM HCL 2 MG/2ML IJ SOLN
INTRAMUSCULAR | Status: AC
  Filled 2016-05-29: qty 2

## 2016-05-29 MED ORDER — OXYCODONE HCL 5 MG PO TABS
5.0000 mg | ORAL_TABLET | Freq: Once | ORAL | Status: AC | PRN
  Administered 2016-05-29: 5 mg via ORAL
  Filled 2016-05-29: qty 1

## 2016-05-29 MED ORDER — LIDOCAINE 2% (20 MG/ML) 5 ML SYRINGE
INTRAMUSCULAR | Status: DC | PRN
  Administered 2016-05-29: 100 mg via INTRAVENOUS

## 2016-05-29 MED ORDER — 0.9 % SODIUM CHLORIDE (POUR BTL) OPTIME
TOPICAL | Status: DC | PRN
  Administered 2016-05-29: 1000 mL

## 2016-05-29 MED ORDER — TRAMADOL HCL 50 MG PO TABS: 50.0000 mg | ORAL_TABLET | Freq: Four times a day (QID) | ORAL | 0 refills | Status: DC | PRN

## 2016-05-29 MED ORDER — BUPIVACAINE-EPINEPHRINE 0.5% -1:200000 IJ SOLN
INTRAMUSCULAR | Status: DC | PRN
  Administered 2016-05-29: 20 mL

## 2016-05-29 MED ORDER — ONDANSETRON HCL 4 MG/2ML IJ SOLN
INTRAMUSCULAR | Status: DC | PRN
  Administered 2016-05-29: 4 mg via INTRAVENOUS

## 2016-05-29 SURGICAL SUPPLY — 33 items
BENZOIN TINCTURE PRP APPL 2/3 (GAUZE/BANDAGES/DRESSINGS) IMPLANT
BNDG GAUZE ELAST 4 BULKY (GAUZE/BANDAGES/DRESSINGS) IMPLANT
CLOSURE WOUND 1/2 X4 (GAUZE/BANDAGES/DRESSINGS)
COVER SURGICAL LIGHT HANDLE (MISCELLANEOUS) ×3 IMPLANT
DERMABOND ADVANCED (GAUZE/BANDAGES/DRESSINGS) ×2
DERMABOND ADVANCED .7 DNX12 (GAUZE/BANDAGES/DRESSINGS) ×1 IMPLANT
DRAIN PENROSE 18X1/4 LTX STRL (WOUND CARE) ×3 IMPLANT
DRAPE LAPAROTOMY T 98X78 PEDS (DRAPES) ×3 IMPLANT
DRSG KUZMA FLUFF (GAUZE/BANDAGES/DRESSINGS) ×3 IMPLANT
ELECT REM PT RETURN 15FT ADLT (MISCELLANEOUS) ×3 IMPLANT
GAUZE SPONGE 4X4 12PLY STRL (GAUZE/BANDAGES/DRESSINGS) IMPLANT
GAUZE XEROFORM 1X8 LF (GAUZE/BANDAGES/DRESSINGS) ×3 IMPLANT
GLOVE BIOGEL M STRL SZ7.5 (GLOVE) ×3 IMPLANT
GOWN STRL REUS W/TWL XL LVL3 (GOWN DISPOSABLE) ×3 IMPLANT
KIT BASIN OR (CUSTOM PROCEDURE TRAY) ×3 IMPLANT
NEEDLE HYPO 22GX1.5 SAFETY (NEEDLE) ×3 IMPLANT
NS IRRIG 1000ML POUR BTL (IV SOLUTION) ×3 IMPLANT
PACK GENERAL/GYN (CUSTOM PROCEDURE TRAY) ×3 IMPLANT
STRIP CLOSURE SKIN 1/2X4 (GAUZE/BANDAGES/DRESSINGS) IMPLANT
SUPPORT SCROTAL LG STRP (MISCELLANEOUS) ×2 IMPLANT
SUPPORTER ATHLETIC LG (MISCELLANEOUS) ×1
SUT MNCRL AB 4-0 PS2 18 (SUTURE) ×3 IMPLANT
SUT SILK 0 (SUTURE) ×2
SUT SILK 0 30XBRD TIE 6 (SUTURE) ×1 IMPLANT
SUT SILK 2 0 (SUTURE) ×2
SUT SILK 2-0 18XBRD TIE 12 (SUTURE) ×1 IMPLANT
SUT SILK 3 0 SH 30 (SUTURE) ×3 IMPLANT
SUT VIC AB 2-0 SH 27 (SUTURE)
SUT VIC AB 2-0 SH 27X BRD (SUTURE) IMPLANT
SUT VIC AB 3-0 SH 27 (SUTURE) ×6
SUT VIC AB 3-0 SH 27XBRD (SUTURE) ×3 IMPLANT
SYR CONTROL 10ML LL (SYRINGE) ×3 IMPLANT
TOWEL OR 17X26 10 PK STRL BLUE (TOWEL DISPOSABLE) ×6 IMPLANT

## 2016-05-29 NOTE — Anesthesia Procedure Notes (Signed)
Procedure Name: Intubation Date/Time: 05/29/2016 7:42 AM Performed by: Doran ClayALDAY, Jatavis Malek R Pre-anesthesia Checklist: Patient identified, Emergency Drugs available, Suction available, Patient being monitored and Timeout performed Patient Re-evaluated:Patient Re-evaluated prior to inductionOxygen Delivery Method: Circle system utilized Preoxygenation: Pre-oxygenation with 100% oxygen Intubation Type: IV induction LMA: LMA inserted LMA Size: 5.0 Number of attempts: 1 Tube secured with: Tape Dental Injury: Teeth and Oropharynx as per pre-operative assessment

## 2016-05-29 NOTE — Op Note (Signed)
Operative Note  PRE-OPERATIVE DIAGNOSIS: Right hydrocele  POST-OPERATIVE DIAGNOSIS:  Same  PROCEDURE: Right hydrocelectomy  SURGEON:  Abreanna Drawdy  RESIDENT: [place]  ANESTHESIA:  General  EBL:  Minimal  DRAINS: None  SPECIMEN:  None  INDICATION: 46 year old Spanish speaking male with right hydrocele who elects to undergo right hydrocelectomy. Please see prior clinic consult H&P for further details. Written consent signed after discussion of procedure, risks, benefits, alternatives, potential complications. He is eager to proceed.  PROCEDURE: After informed consent the patient was brought to the major OR, placed on the table and administered general anesthesia. His genitalia was then sterilely prepped and draped. An official timeout was then performed.  A transverse right hemiscrotal incision was then made and carried down over the right hydrocele. The tissue over the hydrocele was cleared using a combination of sharp and blunt technique. The hydrocele was delivered through the incision. The sac was opened sharply and drained approximately 500cc straw fluid. The excess hydrocele sac tissue was excised with the Bovie electrocautery and then I cauterized the edges. I then reapproximated the edges posteriorly behind the testicle in a Jaboulay fashion with a running 3-0 vicryl suture. His testicle was then replaced in the normal anatomic position in his right hemiscrotum. I then closed his dartos tissue with running 3-0 vicryl suture in a simple running fashion. I injected Marcaine with epi in the subcutaneous tissue and closed the skin with a simple running 4-0 monocryl. Dermabond was then applied and allowed to fully dry. Xeroform gauze was applied overtop of the Dermabond. Mummy wrap and fluffs were applied before a jock strap. The patient tolerated the procedure well no intraoperative complications. Needle sponge and instrument counts were correct at the end of the operation.   PLAN OF CARE:  Discharge to home after PACU  PATIENT DISPOSITION:  PACU - hemodynamically stable.

## 2016-05-29 NOTE — Anesthesia Postprocedure Evaluation (Signed)
Anesthesia Post Note  Patient: Hunter Todd, Hunter Todd  Procedure(s) Performed: Procedure(s) (LRB): RIGHT HYDROCELECTOMY ADULT (Right)  Patient location during evaluation: PACU Anesthesia Type: General Level of consciousness: sedated and patient cooperative Pain management: pain level controlled Vital Signs Assessment: post-procedure vital signs reviewed and stable Respiratory status: spontaneous breathing Cardiovascular status: stable Anesthetic complications: no       Last Vitals:  Vitals:   05/29/16 1012 05/29/16 1130  BP: 116/81 126/80  Pulse: 75 78  Resp: 14 16  Temp: 36.8 C 36.7 C    Last Pain:  Vitals:   05/29/16 1130  TempSrc: Oral  PainSc: 2                  Lewie LoronJohn Destany Severns

## 2016-05-29 NOTE — Discharge Instructions (Signed)
Hidrocelectoma, cuidados posteriores (Hydrocelectomy, Care After) Siga estas instrucciones durante las prximas semanas. Estas indicaciones le proporcionan informacin acerca de cmo deber cuidarse despus del procedimiento. El mdico tambin podr darle instrucciones ms especficas. El tratamiento ha sido planificado segn las prcticas mdicas actuales, pero en algunos casos pueden ocurrir problemas. Comunquese con el mdico si tiene algn problema o dudas despus del procedimiento. QU ESPERAR DESPUS DEL PROCEDIMIENTO Despus del procedimiento, es normal que la bolsa que contiene los testculos (escroto) est adolorida, hinchada y con hematomas. INSTRUCCIONES PARA EL CUIDADO EN EL HOGAR El bao   Pregntele al mdico cundo puede comenzar a baarse o Programmer, systemsnadar.  Si le indicaron que use un soporte deportivo, squeselo cuando se bae. Cuidado de la incisin   Siga las indicaciones del mdico acerca del cuidado de la incisin. Haga lo siguiente:  BorgWarnerLvese las manos con agua y jabn antes de Multimedia programmercambiar las vendas (vendaje). Use desinfectante para manos si no dispone de [place] y [place]n.  Cambie el vendaje como se lo haya indicado el mdico.  No retire los puntos (suturas).  Controle la incisin y el escroto todos los [address]  [id number] para detectar signos de infeccin. Est atento a los siguientes signos:  Aumento del enrojecimiento, la hinchazn o Chief Technology Officerel dolor.  Hemorragias o secreciones.  Calor.  Pus o mal olor. Control del dolor, la rigidez y la hinchazn   Si se lo indican, aplique hielo sobre la zona lesionada:  Ponga el hielo en una bolsa plstica.  Coloque una toalla entre la piel y la bolsa de hielo.  Coloque el hielo durante 20minutos, 2 a 3veces por Futures traderda. Conducir   No conduzca durante 24horas si le administraron un sedante.  No conduzca ni opere maquinaria pesada mientras toma analgsicos recetados.  Pregntele al mdico cundo es seguro volver a Science writerconducir. Actividad   No realice  ninguna actividad que demande un gran esfuerzo y Engineer, drillingenerga (que sean intensas) por el tiempo que le haya indicado el mdico.  Reanude sus actividades normales como se lo haya indicado el mdico. Pregntele al mdico qu actividades son seguras para usted.  No levante objetos que pesen ms de 10libras (4,5kg) hasta que el mdico le diga que es seguro. Instrucciones generales   Baxter Internationalome los medicamentos de venta libre y los recetados solamente como se lo haya indicado el mdico.  OceanographerConcurra a todas las visitas de control como se lo haya indicado el mdico. Esto es importante.  Si le indicaron que use un soporte deportivo, selo como se lo haya indicado el mdico.  Si le colocaron un drenaje durante el procedimiento, deber volver para que se lo retiren. SOLICITE ATENCIN MDICA SI:  El dolor empeora.  Tiene ms enrojecimiento, hinchazn o dolor alrededor del escroto.  Observa lquido o sangre que salen del escroto.  La incisin est caliente al tacto.  Tiene pus o percibe mal olor que proviene del escroto.  Tiene fiebre. Esta informacin no tiene Theme park managercomo fin reemplazar el consejo del mdico. Asegrese de hacerle al mdico cualquier pregunta que tenga. Document Released: 12/25/2014 Elsevier Interactive Patient Education  2017 [employer]   Hydrocelectomy, Adult, Care After This sheet gives you information about how to care for yourself after your procedure. Your health care provider may also give you more specific instructions. If you have problems or questions, contact your health care provider. What can I expect after the procedure? After your procedure, it is common to have mild discomfort, swelling, and bruising in the pouch that holds your testicles (scrotum). Follow these instructions  at home: Bathing   Ask your health care provider when you can shower, take baths, or go swimming.  If you were told to wear an athletic support strap, take it off when you shower or take a  bath. Incision care   Follow instructions from your health care provider about how to take care of your incision. Make sure you:  Wash your hands with soap and water before you change your bandage (dressing). If soap and water are not available, use hand sanitizer.  Change your dressing as told by your health care provider.  Leave stitches (sutures) in place.  Check your incision and scrotum every day for signs of infection. Check for:  More redness, swelling, or pain.  Blood or fluid.  Warmth.  Pus or a bad smell. Managing pain, stiffness, and swelling   If directed, apply ice to the injured area:  Put ice in a plastic bag.  Place a towel between your skin and the bag.  Leave the ice on for 20 minutes, 2-3 times per day. Driving   Do not drive for 24 hours if you were given a sedative.  Do not drive or use heavy machinery while taking prescription pain medicine.  Ask your health care provider when it is safe to drive. Activity   Do not do any activities that require great strength and energy (are vigorous) for as long as told by your health care provider.  Return to your normal activities as told by your health care provider. Ask your health care provider what activities are safe for you.  Do not lift anything that is heavier than 10 lb (4.5 kg) until your health care provider says that it is safe. General instructions   Take over-the-counter and prescription medicines only as told by your health care provider.  Keep all follow-up visits as told by your health care provider. This is important.  If you were given an athletic support strap, wear it as told by your health care provider.  If you had a drain put in during the procedure, you will need to return for a follow-up visit to have it removed. Contact a health care provider if:  Your pain is not controlled with medicine.  You have more redness or swelling around your scrotum.  You have blood or fluid  coming from your scrotum.  Your incision feels warm to the touch.  You have pus or a bad smell coming from your scrotum.  You have a fever. This information is not intended to replace advice given to you by your health care provider. Make sure you discuss any questions you have with your health care provider. Document Released: 10/04/2014 Document Revised: 10/13/2015 Document Reviewed: 10/13/2015 Elsevier Interactive Patient Education  2017 [employer].

## 2016-05-29 NOTE — H&P (Signed)
CC/HPI: CC - Follow up R hydrocele.   Patient comes back to clinic after 11/08/15 visit considering therapy for right hydrocele. Thinks he prefers aspiration/sclerotherapy but wants to talk about it. No fevers, n/v, abdominal pain, dysuria, UTI, hematuria. Please see last note for more info.     ALLERGIES: No Known Allergies    MEDICATIONS: None   GU PSH: None   NON-GU PSH: None   GU PMH: Encysted hydrocele - 11/08/2015    NON-GU PMH: Hypercholesterolemia Hyperlipidemia, unspecified Hypertension    FAMILY HISTORY: 1 Daughter - Daughter 5 sons - Son father deceased at age 30 bone cancer - Father mother still living at age 67 - Mother   SOCIAL HISTORY: Marital Status: Married Current Smoking Status: Patient smokes. Has smoked since 10/28/1990. Smokes 1 pack per day.  Drinks 2 drinks per day.  Does not drink caffeine. Patient's occupation Primary school teacher.    REVIEW OF SYSTEMS:    GU Review Male:   Patient denies frequent urination, hard to postpone urination, burning/ pain with urination, get up at night to urinate, leakage of urine, stream starts and stops, trouble starting your stream, have to strain to urinate , erection problems, and penile pain.  Gastrointestinal (Upper):   Patient denies nausea, vomiting, and indigestion/ heartburn.  Gastrointestinal (Lower):   Patient denies diarrhea and constipation.  Constitutional:   Patient denies fever, night sweats, weight loss, and fatigue.  Skin:   Patient denies skin rash/ lesion and itching.  Eyes:   Patient denies blurred vision and double vision.  Ears/ Nose/ Throat:   Patient denies sore throat and sinus problems.  Hematologic/Lymphatic:   Patient denies swollen glands and easy bruising.  Cardiovascular:   Patient denies leg swelling and chest pains.  Respiratory:   Patient denies cough and shortness of breath.  Endocrine:   Patient denies excessive thirst.  Musculoskeletal:   Patient denies back pain and  joint pain.  Neurological:   Patient denies dizziness and headaches.  Psychologic:   Patient denies depression and anxiety.   VITAL SIGNS:      04/10/2016 01:58 PM  Weight 249 lb / 112.94 kg  Height 69 in / 175.26 cm  BP 134/89 mmHg  Pulse 85 /min  Temperature 98.6 F / 37 C  BMI 36.8 kg/m   GU PHYSICAL EXAMINATION:    Anus and Perineum: No hemorrhoids. No anal stenosis. No rectal fissure, no anal fissure. No edema, no dimple, no perineal tenderness, no anal tenderness.  Scrotum: No lesions. No edema. No cysts. No warts.  Epididymides: Left: No spermatocele, no masses, no cysts, no tenderness, no induration, no enlargement. Rith epididymis and cord structures are not palpable.   Testes: No tenderness, no swelling, no enlargement left testis. Normal location left testis. No mass, no cyst, no varicocele, no hydrocele left testis. Large right hydrocele. Right testicle not palpable.   Urethral Meatus: Normal size. No lesion, no wart, no discharge, no polyp. Normal location.  Penis: Uncircumcised, no foreskin warts, no cracks. No dorsal peyronie's plaques, no left corporal peyronie's plaques, no right corporal peyronie's plaques, no scarring, no shaft warts. No balanitis, no meatal stenosis.   Prostate: 40 gram or 2+ size. Left lobe normal consistency, right lobe normal consistency. Symmetrical lobes. No prostate nodule. Left lobe no tenderness, right lobe no tenderness.  Seminal Vesicles: Nonpalpable.  Sphincter Tone: Normal sphincter. No rectal tenderness. No rectal mass.    MULTI-SYSTEM PHYSICAL EXAMINATION:    Constitutional: Well-nourished. No physical deformities. Normally developed. Good  grooming.  Neck: Neck symmetrical, not swollen. Normal tracheal position.  Respiratory: No labored breathing, no use of accessory muscles.   Cardiovascular: Normal temperature, normal extremity pulses, no swelling, no varicosities.  Lymphatic: No enlargement of neck, axillae, groin.  Skin: No  paleness, no jaundice, no cyanosis. No lesion, no ulcer, no rash.  Neurologic / Psychiatric: Oriented to time, oriented to place, oriented to person. No depression, no anxiety, no agitation.  Gastrointestinal: No mass, no tenderness, no rigidity, non obese abdomen.  Eyes: Normal conjunctivae. Normal eyelids.  Ears, Nose, Mouth, and Throat: Left ear no scars, no lesions, no masses. Right ear no scars, no lesions, no masses. Nose no scars, no lesions, no masses. Normal hearing. Normal lips.  Musculoskeletal: Normal gait and station of head and neck.     PAST DATA REVIEWED:  Source Of History:  Patient   PROCEDURES:          Urinalysis Dipstick Dipstick Cont'd  Color: Yellow Bilirubin: Neg  Appearance: Clear Ketones: Neg  Specific Gravity: 1.025 Blood: Neg  pH: 7.5 Protein: Neg  Glucose: Neg Urobilinogen: 0.2    Nitrites: Neg    Leukocyte Esterase: Neg    ASSESSMENT:      ICD-10 Details  1 GU:   Encysted hydrocele - N43.0 Stable          Notes:   46yo male with large right sided hydrocele. We again discussed therapies including OBS, aspiration +/- sclerotherapy, and hydrocelectomy. I recommended hydrocelectomy based on his size and recurrence risk. Risks of bleeding, infection, damage to surrounding structures, hematoma, recurrence risk, requirement for reoperation.     PLAN:            Medications Stop Meds: Metoprolol Succinate  Discontinue: 04/10/2016  - Reason: The medication cycle was completed.  Amoxicillin  Discontinue: 04/10/2016  - Reason: The medication cycle was completed.            Document Letter(s):  Created for Patient: Clinical Summary         Notes:   Right hydrocelectomy next available date

## 2016-05-29 NOTE — Transfer of Care (Signed)
Immediate Anesthesia Transfer of Care Note  Patient: [name], Hunter Todd  Procedure(s) Performed: Procedure(s): RIGHT HYDROCELECTOMY ADULT (Right)  Patient Location: PACU  Anesthesia Type:General  Level of Consciousness: sedated  Airway & Oxygen Therapy: Patient Spontanous Breathing and Patient connected to face mask oxygen  Post-op Assessment: Report given to RN and Post -op Vital signs reviewed and stable  Post vital signs: Reviewed and stable  Last Vitals:  Vitals:   05/29/16 0530  BP: 129/83  Pulse: 78  Resp: 16  Temp: 37.1 C    Last Pain:  Vitals:   05/29/16 0530  TempSrc: Oral         Complications: No apparent anesthesia complications

## 2016-06-19 ENCOUNTER — Ambulatory Visit: Payer: Self-pay | Admitting: Internal Medicine

## 2016-06-26 ENCOUNTER — Ambulatory Visit: Payer: Self-pay | Admitting: Internal Medicine

## 2016-07-08 ENCOUNTER — Encounter (INDEPENDENT_AMBULATORY_CARE_PROVIDER_SITE_OTHER): Payer: Self-pay | Admitting: Physician Assistant

## 2016-07-08 ENCOUNTER — Ambulatory Visit (INDEPENDENT_AMBULATORY_CARE_PROVIDER_SITE_OTHER): Payer: Self-pay | Admitting: Physician Assistant

## 2016-07-08 VITALS — BP 121/80 | HR 84 | Temp 98.6°F | Ht 69.0 in | Wt 249.2 lb

## 2016-07-08 DIAGNOSIS — E785 Hyperlipidemia, unspecified: Secondary | ICD-10-CM

## 2016-07-08 DIAGNOSIS — I10 Essential (primary) hypertension: Secondary | ICD-10-CM

## 2016-07-08 DIAGNOSIS — R7303 Prediabetes: Secondary | ICD-10-CM

## 2016-07-08 MED ORDER — LOVASTATIN 20 MG PO TABS: 20.0000 mg | ORAL_TABLET | Freq: Every day | ORAL | 3 refills | Status: DC

## 2016-07-08 MED ORDER — METFORMIN HCL ER 500 MG PO TB24: 500.0000 mg | ORAL_TABLET | Freq: Every day | ORAL | 3 refills | Status: DC

## 2016-07-08 MED ORDER — ASPIRIN EC 81 MG PO TBEC
81.0000 mg | DELAYED_RELEASE_TABLET | Freq: Every day | ORAL | 3 refills | Status: DC
Start: 2016-07-08 — End: 2016-11-06

## 2016-07-08 NOTE — Progress Notes (Signed)
Subjective:  Patient ID: Hunter Todd, male    DOB: [birth year]  Age: 46 y.o. MRN: [id number]  CC: establish care  HPI Hunter Todd is a 46 y.o. male with a PMH of HTN, pre-diabetes, HLD, alcohol abuse, and tobacco use presents to establish care. Has labs from 04/18/16 that show elevated triglycerides, LDL, and an A1c of 6.0. He has stopped smoking and drinking alcohol. Feels well and takes amlodipine as directed. No currently on any special diet or exercise program. Does not endorse CP, palpitations, SOB, HA, abdominal pain, f/c/n/v, rash, or GI/GU symptoms.     Outpatient Medications Prior to Visit  Medication Sig Dispense Refill  . amLODipine (NORVASC) 5 MG tablet Take 1 tablet (5 mg total) by mouth daily. (Patient taking differently: Take 5 mg by mouth every evening. ) 30 tablet 11  . nicotine (NICODERM CQ - DOSED IN MG/24 HOURS) 14 mg/24hr patch Place 1 patch (14 mg total) onto the skin daily. (Patient not taking: Reported on 05/01/2016) 14 patch 0  . nicotine (NICODERM CQ - DOSED IN MG/24 HOURS) 21 mg/24hr patch Place 1 patch (21 mg total) onto the skin daily. (Patient not taking: Reported on 05/01/2016) 28 patch 0  . nicotine (NICODERM CQ - DOSED IN MG/24 HR) 7 mg/24hr patch Place 1 patch (7 mg total) onto the skin daily. (Patient not taking: Reported on 05/01/2016) 14 patch 0  . traMADol (ULTRAM) 50 MG tablet Take 1 tablet (50 mg total) by mouth every 6 (six) hours as needed. (Patient not taking: Reported on 07/08/2016) 15 tablet 0   No facility-administered medications prior to visit.      ROS Review of Systems  Constitutional: Negative for chills, fever and malaise/fatigue.  Eyes: Negative for blurred vision.  Respiratory: Negative for shortness of breath.   Cardiovascular: Negative for chest pain and palpitations.  Gastrointestinal: Negative for abdominal pain and nausea.  Genitourinary: Negative for dysuria and hematuria.  Musculoskeletal: Negative for joint pain and  myalgias.  Skin: Negative for rash.  Neurological: Negative for tingling and headaches.  Psychiatric/Behavioral: Negative for depression. The patient is not nervous/anxious.     Objective:  BP 121/80 (BP Location: Left Arm, Patient Position: Sitting, Cuff Size: Large)   Pulse 84   Temp 98.6 F (37 C) (Oral)   Ht 5\' 9"  (1.753 m)   Wt 249 lb 3.2 oz (113 kg)   SpO2 95%   BMI 36.80 kg/m   BP/Weight 07/08/2016 05/29/2016 05/26/2016  Systolic BP 121 126 125  Diastolic BP 80 80 75  Wt. (Lbs) 249.2 254.38 254.4  BMI 36.8 36.5 36.5      Physical Exam  Constitutional: He is oriented to person, place, and time.  Well developed, obese, NAD, polite  HENT:  Head: Normocephalic and atraumatic.  Eyes: No scleral icterus.  Neck: Normal range of motion. Neck supple. No thyromegaly present.  Cardiovascular: Normal rate, regular rhythm and normal heart sounds.   Pulmonary/Chest: Effort normal and breath sounds normal.  Abdominal: Soft. Bowel sounds are normal. There is no tenderness.  Musculoskeletal: He exhibits no edema.  Neurological: He is alert and oriented to person, place, and time. No cranial nerve deficit. Coordination normal.  Skin: Skin is warm and dry. No rash noted. No erythema. No pallor.  Psychiatric: He has a normal mood and affect. His behavior is normal. Thought content normal.  Vitals reviewed.    Assessment & Plan:   1. Essential hypertension - Begin aspirin EC 81 MG tablet; Take 1 tablet (81  mg total) by mouth daily.  Dispense: 90 tablet; Refill: 3  2. Hyperlipidemia, unspecified hyperlipidemia type - Begin lovastatin (MEVACOR) 20 MG tablet; Take 1 tablet (20 mg total) by mouth at bedtime.  Dispense: 90 tablet; Refill: 3  3. Prediabetes - Begin metFORMIN (GLUCOPHAGE-XR) 500 MG 24 hr tablet; Take 1 tablet (500 mg total) by mouth daily with breakfast.  Dispense: 90 tablet; Refill: 3   Meds ordered this encounter  Medications  . lovastatin (MEVACOR) 20 MG tablet     Sig: Take 1 tablet (20 mg total) by mouth at bedtime.    Dispense:  90 tablet    Refill:  3    Order Specific Question:   Supervising Provider    Answer:   Quentin AngstJEGEDE, OLUGBEMIGA E [id number]  . metFORMIN (GLUCOPHAGE-XR) 500 MG 24 hr tablet    Sig: Take 1 tablet (500 mg total) by mouth daily with breakfast.    Dispense:  90 tablet    Refill:  3    Order Specific Question:   Supervising Provider    Answer:   Quentin AngstJEGEDE, OLUGBEMIGA E [id number]  . aspirin EC 81 MG tablet    Sig: Take 1 tablet (81 mg total) by mouth daily.    Dispense:  90 tablet    Refill:  3    Order Specific Question:   Supervising Provider    Answer:   Quentin AngstJEGEDE, OLUGBEMIGA E [id number]    Follow-up: Return in about 3 months (around 10/08/2016) for HTN, prediabetes, and HLD.   Loletta Specteroger David Stefanny Pieri PA

## 2016-07-08 NOTE — Patient Instructions (Signed)
Opciones de alimentos para bajar el nivel de triglicridos  (Food Choices to Lower Your Triglycerides)  Los triglicridos son un tipo de grasas que se encuentran en la sangre. Un nivel elevado de triglicridos puede aumentar el riesgo de padecer enfermedades cardacas e infartos. Si sus niveles de triglicridos son altos, los alimentos que se ingieren y los hbitos de alimentacin son muy importantes. Elegir los alimentos adecuados puede ayudar a bajar el nivel de triglicridos.  QU PAUTAS GENERALES DEBO SEGUIR?   Baje de peso si es necesario.   Limite o evite el alcohol.   Llene la mitad del plato con vegetales y ensaladas de hojas verdes.   Limite las frutas a dos porciones por da. Elija frutas en lugar de jugos.   Ocupe un cuarto del plato con cereales integrales. Busque la palabra "integral" en el primer lugar de la lista de ingredientes.   Llene un cuarto del plato con alimentos con protenas magras.   Disfrute de pescados grasos (como salmn, caballa, sardinas y atn) tres veces por semana.   Elija las grasas saludables.   Limite los alimentos con alto contenido de almidn y azcar.   Consuma ms comida casera y menos de restaurante, de buf y comida rpida.   Limite el consumo de alimentos fritos.   Cocine los alimentos utilizando mtodos que no sean la fritura.   Limite el consumo de grasas saturadas.   Verifique las listas de ingredientes para evitar alimentos con aceites parcialmente hidrogenados (grasas trans).    QU ALIMENTOS PUEDO COMER?  Cereales  Cereales integrales, como los panes de salvado o integrales, las galletas, los cereales y las pastas. Avena sin endulzar, trigo, cebada, quinua o arroz integral. Tortillas de harina de maz o de salvado.  Vegetales  Verduras frescas o congeladas (crudas, al vapor, asadas o grilladas). Ensaladas de hojas verdes.  Fruits  Frutas frescas, en conserva (en su jugo natural) o frutas congeladas.  Carnes y otros productos con protenas  Carne de  res molida (al 85% o ms magra), carne de res de animales alimentados con pastos o carne de res sin la grasa. Pollo o pavo sin piel. Carne de pollo o de pavo molida. Cerdo sin la grasa. Todos los pescados y frutos de mar. Huevos. Porotos, guisantes o lentejas secos. Frutos secos o semillas sin sal. Frijoles secos o en lata sin sal.  Lcteos  Productos lcteos con bajo contenido de grasas, como leche descremada o al 1%, quesos reducidos en grasas o al 2%, ricota con bajo contenido de grasas o queso cottage, o yogur natural con bajo contenido de grasas.  Grasas y aceites  Margarinas en barra que no contengan grasas trans. [place] y condimentos para ensaladas livianos o reducidos en grasas. [place]. Aceites de crtamo, oliva o canola. Mantequilla natural de man o almendra.  Los artculos mencionados arriba pueden no ser una lista completa de las bebidas o los alimentos recomendados. Comunquese con el nutricionista para conocer ms opciones.  QU ALIMENTOS NO SE RECOMIENDAN?  Cereales  Pan blanco. Pastas blancas. Arroz blanco. Pan de maz. Bagels, pasteles y croissants. Galletas saladas que contengan grasas trans.  Vegetales  Papas blancas. Maz. Vegetales con crema o fritos. Verduras en salsa de queso.  Fruits  Frutas secas. Fruta enlatada en almbar liviano o espeso. Jugo de frutas.  Carnes y otros productos con protenas  Cortes de carne con grasa. Costillas, alas de pollo, tocineta, salchicha, mortadela, salame, chinchulines, tocino, perros calientes, salchichas alemanas y embutidos envasados.  Lcteos  Leche   entera o al 2%, crema, mezcla de leche y crema y queso crema. Yogur entero o endulzado. Quesos con toda su grasa. Cremas no lcteas y coberturas batidas. Quesos procesados, quesos para untar o cuajadas.  Dulces y postres  Jarabe de maz, azcares, miel y melazas. Caramelos. Mermelada y jalea. Jarabe. Cereales endulzados. Galletas, pasteles, bizcochuelos, donas, muffins y helado.  Grasas y  aceites  Mantequilla, margarina en barra, manteca de cerdo, grasa, mantequilla clarificada o grasa de tocino. Aceites de coco, de palmiste o de palma.  Bebidas  Alcohol. Bebidas endulzadas (como refrescos, limonadas y bebidas frutales o ponches).  Los artculos mencionados arriba pueden no ser una lista completa de las bebidas y los alimentos que se deben evitar. Comunquese con el nutricionista para recibir ms informacin.  Esta informacin no tiene como fin reemplazar el consejo del mdico. Asegrese de hacerle al mdico cualquier pregunta que tenga.  Document Released: 07/03/2009 Document Revised: 01/18/2013 Document Reviewed: 11/17/2012  Elsevier Interactive Patient Education  2017 Elsevier Inc.

## 2016-10-08 ENCOUNTER — Encounter (INDEPENDENT_AMBULATORY_CARE_PROVIDER_SITE_OTHER): Payer: Self-pay | Admitting: Physician Assistant

## 2016-10-08 ENCOUNTER — Ambulatory Visit (INDEPENDENT_AMBULATORY_CARE_PROVIDER_SITE_OTHER): Payer: Self-pay | Admitting: Physician Assistant

## 2016-10-08 VITALS — BP 116/76 | HR 69 | Temp 98.1°F | Wt 250.8 lb

## 2016-10-08 DIAGNOSIS — M25512 Pain in left shoulder: Secondary | ICD-10-CM

## 2016-10-08 DIAGNOSIS — R7303 Prediabetes: Secondary | ICD-10-CM

## 2016-10-08 DIAGNOSIS — I10 Essential (primary) hypertension: Secondary | ICD-10-CM

## 2016-10-08 DIAGNOSIS — E785 Hyperlipidemia, unspecified: Secondary | ICD-10-CM

## 2016-10-08 LAB — POCT GLYCOSYLATED HEMOGLOBIN (HGB A1C): Hemoglobin A1C: 5.9

## 2016-10-08 MED ORDER — ACETAMINOPHEN 500 MG PO TABS: 1000.0000 mg | ORAL_TABLET | Freq: Three times a day (TID) | ORAL | 0 refills | Status: AC | PRN

## 2016-10-08 MED ORDER — NAPROXEN 500 MG PO TABS: 500.0000 mg | ORAL_TABLET | Freq: Two times a day (BID) | ORAL | 0 refills | Status: DC

## 2016-10-08 NOTE — Progress Notes (Signed)
Subjective:  Patient ID: Hunter Todd, male    DOB: 21-Aug-1970  Age: 46 y.o. MRN: [id number]  CC: HTN  HPI  Hunter Todd is a 46 y.o. male with a PMH of HTN, pre-diabetes, HLD, alcohol abuse, and tobacco use presents to f/u on HTN and DM. Has labs from 04/18/16 that show elevated triglycerides, LDL, and an A1c of 6.0. Hunter Todd has begun to smoke again. Stopped drinking alcohol. Feels well and takes medications as directed.    Left shoulder pain since one month ago. Has pain in the anterior portion of the shoulder. Hurts with shoulder adduction and with throwing motion. Denies tingling, numbness, weakness, paralysis, edema, erythema, ecchymosis, or trauma.   Outpatient Medications Prior to Visit  Medication Sig Dispense Refill  . amLODipine (NORVASC) 5 MG tablet Take 1 tablet (5 mg total) by mouth daily. (Patient taking differently: Take 5 mg by mouth every evening. ) 30 tablet 11  . aspirin EC 81 MG tablet Take 1 tablet (81 mg total) by mouth daily. 90 tablet 3  . lovastatin (MEVACOR) 20 MG tablet Take 1 tablet (20 mg total) by mouth at bedtime. 90 tablet 3  . metFORMIN (GLUCOPHAGE-XR) 500 MG 24 hr tablet Take 1 tablet (500 mg total) by mouth daily with breakfast. 90 tablet 3   No facility-administered medications prior to visit.      ROS Review of Systems  Constitutional: Negative for chills, fever and malaise/fatigue.  Eyes: Negative for blurred vision.  Respiratory: Negative for shortness of breath.   Cardiovascular: Negative for chest pain and palpitations.  Gastrointestinal: Negative for abdominal pain and nausea.  Genitourinary: Negative for dysuria and hematuria.  Musculoskeletal: Positive for joint pain. Negative for myalgias.  Skin: Negative for rash.  Neurological: Negative for tingling and headaches.  Psychiatric/Behavioral: Negative for depression. The patient is not nervous/anxious.     Objective:  BP 116/76 (BP Location: Left Arm, Patient Position: Sitting, Cuff  Size: Large)   Pulse 69   Temp 98.1 F (36.7 C) (Oral)   Wt 250 lb 12.8 oz (113.8 kg)   SpO2 93%   BMI 37.04 kg/m   BP/Weight 10/08/2016 07/08/2016 05/29/2016  Systolic BP 116 121 126  Diastolic BP 76 80 80  Wt. (Lbs) 250.8 249.2 254.38  BMI 37.04 36.8 36.5      Physical Exam  Constitutional: Hunter Todd is oriented to person, place, and time.  Well developed, overweight, NAD, polite  HENT:  Head: Normocephalic and atraumatic.  Eyes: No scleral icterus.  Neck: Normal range of motion.  Cardiovascular: Normal rate, regular rhythm and normal heart sounds.   Pulmonary/Chest: Effort normal and breath sounds normal.  Musculoskeletal:  Left shoulder with positive cross body adduction test. Mildly positive AC shear test, internal rotation resistance,  and Speed's test. Negative Neer's, Napolean's, and external rotation resistance test.  Neurological: Hunter Todd is alert and oriented to person, place, and time. No cranial nerve deficit. Coordination normal.  Skin: Skin is warm and dry. No rash noted. No erythema. No pallor.  Psychiatric: Hunter Todd has a normal mood and affect. His behavior is normal. Thought content normal.  Vitals reviewed.    Assessment & Plan:    1. Hypertension, unspecified type - Comprehensive metabolic panel  2. Hyperlipidemia, unspecified hyperlipidemia type - Lipid Panel  3. Prediabetes - HgB A1c 5.9% in clinic today. - Advised patient on healthy lower carb alternatives.   4. Acute pain of left shoulder - Positive cross body adduction test. Suspected subacromial impingement  - Begin naproxen (  NAPROSYN) 500 MG tablet; Take 1 tablet (500 mg total) by mouth 2 (two) times daily with a meal.  Dispense: 30 tablet; Refill: 0 - Begin acetaminophen (TYLENOL) 500 MG tablet; Take 2 tablets (1,000 mg total) by mouth every 8 (eight) hours as needed.  Dispense: 21 tablet; Refill: 0 - DG Shoulder Left; Future   Meds ordered this encounter  Medications  . naproxen (NAPROSYN) 500 MG  tablet    Sig: Take 1 tablet (500 mg total) by mouth 2 (two) times daily with a meal.    Dispense:  30 tablet    Refill:  0    Order Specific Question:   Supervising Provider    Answer:   Quentin AngstJEGEDE, OLUGBEMIGA E [id number]  . acetaminophen (TYLENOL) 500 MG tablet    Sig: Take 2 tablets (1,000 mg total) by mouth every 8 (eight) hours as needed.    Dispense:  21 tablet    Refill:  0    Order Specific Question:   Supervising Provider    Answer:   Quentin AngstJEGEDE, OLUGBEMIGA E [id number]    Follow-up: Return in about 4 weeks (around 11/05/2016) for left shoulder pain .   Loletta Specteroger David Amalya Salmons PA

## 2016-10-08 NOTE — Patient Instructions (Signed)
Tendinitis del manguito rotador (Rotator Cuff Tendinitis) La tendinitis del manguito rotador es la inflamacin de las bandas duras, de aspecto de cordn, que conectan el msculo a los huesos (tendones) en el manguito rotador. El manguito rotador es el conjunto de todos los msculos y tendones que conectan el brazo al hombro. El manguito rotador sostiene la cabeza del hueso del brazo (hmero) en el hueco (fosa) del omplato (escpula). CAUSAS Con frecuencia, la tendinitis del manguito rotador se origina con el uso excesivo de la articulacin involucrada. SIGNOS Y SNTOMAS  Dolor intenso en el hombro, que se extiende a la parte externa del brazo sobre el msculo del hombro.  Punto de sensibilidad en el rea lesionada.  El dolor aparece gradualmente y empeora al levantar el brazo hacia un lado (abduccin) o al llevarlo Normajean [place] adentro (rotacin interna).  Puede producir un desgarro crnico: Cuando el tendn del manguito rotador se inflama, aparece el riesgo de no recibir Designer, fashion/clothing de Retail buyer y Conservator, museum/gallery la muerte de las fibras de los tendones. Esto aumenta el riesgo de que el tendn se deshilache o se desgarre por completo.  DIAGNSTICO La tendinitis del manguito rotador se diagnostica despus de Automotive engineer historia clnica, un examen fsico y la revisin de los [place] de los estudios de diagnstico por imgenes. La historia clnica ayuda a determinar el tipo de lesin en el manguito rotador. El examen fsico incluir evaluar el hombro lesionado, palpar el rea y observarlo mientras hace ejercicios para determinar el rango de [place]. Generalmente, se realizan radiografas para descartar otras causas del dolor en el hombro, como fracturas. Suele realizarse una resonancia magntica cuando la lesin en el hombro es significativa. A veces, se realiza un estudio con contraste llamado artrograma por tomografa computarizada, pero no es tan frecuente como la Health visitor. En algunas  instituciones, tambin pueden usarse ecografas especiales para ayudar a Orthoptist. TRATAMIENTO Casos menos graves  Usar un cabestrillo para descansar el hombro durante un breve perodo. El uso prolongado del cabestrillo puede producir rigidez, debilidad y prdida del movimiento de la articulacin del hombro.  Es posible que le receten antinflamatorios, como ibuprofeno o naproxeno. Casos ms graves  Fisioterapia.  Inyecciones de corticoides en la articulacin del hombro.  Ciruga. INSTRUCCIONES PARA EL CUIDADO EN EL HOGAR  Use un cabestrillo o una frula hasta que mejore el dolor. El uso prolongado del cabestrillo puede producir rigidez, debilidad y prdida del movimiento de la articulacin del hombro.  Aplique hielo sobre la zona lesionada. ? Ponga el hielo en una bolsa plstica. ? Colquese una [employer] piel y la bolsa de hielo. ? Deje el hielo durante 20 minutos, 2 a 3 veces por da.  Mientras el tendn le cause dolor, evite todo rango de movimiento que no sea moderado. Use el hombro y haga ejercicios solamente segn las indicaciones de su mdico. Suspenda los ejercicios o reduzca el rango de movimiento si el dolor o las molestias [place], a menos que el mdico le indique otra cosa.  Utilice los medicamentos de venta libre o recetados para Primary school teacher, el malestar o la fiebre, segn se lo indique el mdico.  Si le han inmovilizado el brazo (con un cabestrillo y tirantes), no los retire, excepto que su mdico se lo haya indicado o hasta que vea a su mdico para la visita de control. Si necesita quitarlos, mueva el brazo lo menos posible.  Podr dormir sobre varias almohadas para disminuir la hinchazn y Chief Technology Officer.  SOLICITE ATENCIN  MDICA DE INMEDIATO SI:  El dolor en el hombro [place], o siente un nuevo dolor en el brazo, en la mano o en los dedos que no se alivia con medicamentos.  Presenta sntomas nuevos o desconocidos, especialmente mayor  adormecimiento en las manos o prdida de fuerza.  Empeoran los problemas que lo llevaron a la consulta con el mdico.  El brazo, la mano o los dedos estn adormecidos o siente hormigueos.  El brazo, la mano o los dedos estn hinchados, le duelen o se ven blancos o azules  ASEGRESE DE QUE:  Comprende estas instrucciones.  Controlar su afeccin.  Recibir ayuda de inmediato si no mejora o si empeora.  Esta informacin no tiene Theme park managercomo fin reemplazar el consejo del mdico. Asegrese de hacerle al mdico cualquier pregunta que tenga. Document Released: 05/01/2008 Document Revised: 02/03/2014 Document Reviewed: 08/25/2012 Elsevier Interactive Patient Education  2017 [employer]

## 2016-10-09 ENCOUNTER — Telehealth (INDEPENDENT_AMBULATORY_CARE_PROVIDER_SITE_OTHER): Payer: Self-pay

## 2016-10-09 ENCOUNTER — Other Ambulatory Visit (INDEPENDENT_AMBULATORY_CARE_PROVIDER_SITE_OTHER): Payer: Self-pay | Admitting: Physician Assistant

## 2016-10-09 DIAGNOSIS — E785 Hyperlipidemia, unspecified: Secondary | ICD-10-CM

## 2016-10-09 LAB — COMPREHENSIVE METABOLIC PANEL
A/G RATIO: 1.4 (ref 1.2–2.2)
ALT: 24 IU/L (ref 0–44)
AST: 24 IU/L (ref 0–40)
Albumin: 4.2 g/dL (ref 3.5–5.5)
Alkaline Phosphatase: 113 IU/L (ref 39–117)
BILIRUBIN TOTAL: 0.4 mg/dL (ref 0.0–1.2)
BUN / CREAT RATIO: 16 (ref 9–20)
BUN: 12 mg/dL (ref 6–24)
CHLORIDE: 103 mmol/L (ref 96–106)
CO2: 21 mmol/L (ref 20–29)
Calcium: 9.2 mg/dL (ref 8.7–10.2)
Creatinine, Ser: 0.74 mg/dL — ABNORMAL LOW (ref 0.76–1.27)
GFR calc Af Amer: 131 mL/min/{1.73_m2} (ref >59)
GFR calc non Af Amer: 113 mL/min/{1.73_m2} (ref >59)
GLUCOSE: 112 mg/dL — AB (ref 65–99)
Globulin, Total: 2.9 g/dL (ref 1.5–4.5)
POTASSIUM: 4.4 mmol/L (ref 3.5–5.2)
Sodium: 141 mmol/L (ref 134–144)
Total Protein: 7.1 g/dL (ref 6.0–8.5)

## 2016-10-09 LAB — LIPID PANEL
CHOL/HDL RATIO: 6.2 ratio — AB (ref 0.0–5.0)
Cholesterol, Total: 181 mg/dL (ref 100–199)
HDL: 29 mg/dL — AB (ref >39)
LDL Calculated: 113 mg/dL — ABNORMAL HIGH (ref 0–99)
TRIGLYCERIDES: 194 mg/dL — AB (ref 0–149)
VLDL CHOLESTEROL CAL: 39 mg/dL (ref 5–40)

## 2016-10-09 MED ORDER — LOVASTATIN 20 MG PO TABS: 40.0000 mg | ORAL_TABLET | Freq: Every day | ORAL | 3 refills | Status: DC

## 2016-10-09 NOTE — Telephone Encounter (Signed)
-----   Message from Loletta Specteroger David Gomez, PA-C sent at 10/09/2016  9:00 AM EDT ----- Cholesterol is getting better but still mildly elevated. Please try to reduce carbohydrate and fatty food intake. I am sending out order for him to take two pills of lovastatin 20 mg at night. instead of just one. [facility] [address]

## 2016-10-09 NOTE — Telephone Encounter (Signed)
Using pacific interpreter De NurseMarco [phone], informed patient of results and explained that the number of pills he is taking has changed from 1 to 2 pills. Patient expressed understanding. Maryjean Mornempestt S Celisse Ciulla, CMA

## 2016-11-06 ENCOUNTER — Ambulatory Visit (INDEPENDENT_AMBULATORY_CARE_PROVIDER_SITE_OTHER): Payer: Self-pay | Admitting: Physician Assistant

## 2016-11-06 ENCOUNTER — Encounter (INDEPENDENT_AMBULATORY_CARE_PROVIDER_SITE_OTHER): Payer: Self-pay | Admitting: Physician Assistant

## 2016-11-06 VITALS — BP 123/76 | HR 69 | Temp 98.2°F | Wt 250.2 lb

## 2016-11-06 DIAGNOSIS — M25512 Pain in left shoulder: Secondary | ICD-10-CM

## 2016-11-06 DIAGNOSIS — I10 Essential (primary) hypertension: Secondary | ICD-10-CM

## 2016-11-06 DIAGNOSIS — R7303 Prediabetes: Secondary | ICD-10-CM

## 2016-11-06 DIAGNOSIS — E785 Hyperlipidemia, unspecified: Secondary | ICD-10-CM

## 2016-11-06 MED ORDER — METFORMIN HCL ER 500 MG PO TB24: 500.0000 mg | ORAL_TABLET | Freq: Every day | ORAL | 3 refills | Status: AC

## 2016-11-06 MED ORDER — LOVASTATIN 20 MG PO TABS: 40.0000 mg | ORAL_TABLET | Freq: Every day | ORAL | 3 refills | Status: AC

## 2016-11-06 MED ORDER — ASPIRIN EC 81 MG PO TBEC: 81.0000 mg | DELAYED_RELEASE_TABLET | Freq: Every day | ORAL | 3 refills | Status: AC

## 2016-11-06 MED ORDER — NAPROXEN 500 MG PO TABS: 500.0000 mg | ORAL_TABLET | Freq: Two times a day (BID) | ORAL | 0 refills | Status: AC

## 2016-11-06 MED ORDER — AMLODIPINE BESYLATE 5 MG PO TABS: 5.0000 mg | ORAL_TABLET | Freq: Every day | ORAL | 3 refills | Status: AC

## 2016-11-06 NOTE — Progress Notes (Signed)
Subjective:  Patient ID: Hunter Todd, male    DOB: [birth year]  Age: 46 y.o. MRN: [id number]  CC: left shoulder pain  HPI  Hunter Camposis a 45 y.o.malewith a PMH of HTN, pre-diabetes, HLD, alcohol abuse, and tobacco use presents to f/u on left shoulder pain. Prescribed Naproxen and Tylenol which nearly resolved the pain. Did not get his shoulder xray done. Feels there is no need to proceed with imaging or physical therapy. Has full painless active range of motion. Does not endorse any other symptoms or complaints.    Outpatient Medications Prior to Visit  Medication Sig Dispense Refill  . amLODipine (NORVASC) 5 MG tablet Take 1 tablet (5 mg total) by mouth daily. (Patient taking differently: Take 5 mg by mouth every evening. ) 30 tablet 11  . aspirin EC 81 MG tablet Take 1 tablet (81 mg total) by mouth daily. 90 tablet 3  . lovastatin (MEVACOR) 20 MG tablet Take 2 tablets (40 mg total) by mouth at bedtime. 180 tablet 3  . metFORMIN (GLUCOPHAGE-XR) 500 MG 24 hr tablet Take 1 tablet (500 mg total) by mouth daily with breakfast. 90 tablet 3  . naproxen (NAPROSYN) 500 MG tablet Take 1 tablet (500 mg total) by mouth 2 (two) times daily with a meal. 30 tablet 0   No facility-administered medications prior to visit.      ROS Review of Systems  Constitutional: Negative for chills, fever and malaise/fatigue.  Eyes: Negative for blurred vision.  Respiratory: Negative for shortness of breath.   Cardiovascular: Negative for chest pain and palpitations.  Gastrointestinal: Negative for abdominal pain and nausea.  Genitourinary: Negative for dysuria and hematuria.  Musculoskeletal: Negative for joint pain and myalgias.  Skin: Negative for rash.  Neurological: Negative for tingling and headaches.  Psychiatric/Behavioral: Negative for depression. The patient is not nervous/anxious.     Objective:  Wt 250 lb 3.2 oz (113.5 kg)   BMI 36.95 kg/m   BP/Weight 11/06/2016 10/08/2016  07/08/2016  Systolic BP - 116 121  Diastolic BP - 76 80  Wt. (Lbs) 250.2 250.8 249.2  BMI 36.95 37.04 36.8      Physical Exam  Constitutional: He is oriented to person, place, and time.  Well developed, overweight, NAD, polite  HENT:  Head: Normocephalic and atraumatic.  No oral thrush  Eyes: Conjunctivae are normal. No scleral icterus.  Neck: Normal range of motion. Neck supple. No thyromegaly present.  Cardiovascular: Normal rate, regular rhythm and normal heart sounds.   No LE edema bilaterally  Pulmonary/Chest: Effort normal and breath sounds normal.  Musculoskeletal: He exhibits no edema.  Left shoulder with full painless range of motion  Neurological: He is alert and oriented to person, place, and time.  LUE strength 5/5  Skin: Skin is warm and dry. No rash noted. No erythema. No pallor.  Psychiatric: He has a normal mood and affect. His behavior is normal. Thought content normal.  Vitals reviewed.    Assessment & Plan:   1. Acute pain of left shoulder - Refill naproxen (NAPROSYN) 500 MG tablet; Take 1 tablet (500 mg total) by mouth 2 (two) times daily with a meal.  Dispense: 30 tablet; Refill: 0  2. Hypertension, unspecified type - Refill amLODipine (NORVASC) 5 MG tablet; Take 1 tablet (5 mg total) by mouth daily.  Dispense: 90 tablet; Refill: 3 - Refill aspirin EC 81 MG tablet; Take 1 tablet (81 mg total) by mouth daily.  Dispense: 90 tablet; Refill: 3  3. Hyperlipidemia, unspecified hyperlipidemia  type - Refill lovastatin (MEVACOR) 20 MG tablet; Take 2 tablets (40 mg total) by mouth at bedtime.  Dispense: 180 tablet; Refill: 3  4. Prediabetes - Refill metFORMIN (GLUCOPHAGE-XR) 500 MG 24 hr tablet; Take 1 tablet (500 mg total) by mouth daily with breakfast.  Dispense: 90 tablet; Refill: 3   Meds ordered this encounter  Medications  . amLODipine (NORVASC) 5 MG tablet    Sig: Take 1 tablet (5 mg total) by mouth daily.    Dispense:  90 tablet    Refill:  3     Please give out of pocket price for our patient.    Order Specific Question:   Supervising Provider    Answer:   Quentin Angst [id number]  . aspirin EC 81 MG tablet    Sig: Take 1 tablet (81 mg total) by mouth daily.    Dispense:  90 tablet    Refill:  3    Order Specific Question:   Supervising Provider    Answer:   Quentin Angst [id number]  . lovastatin (MEVACOR) 20 MG tablet    Sig: Take 2 tablets (40 mg total) by mouth at bedtime.    Dispense:  180 tablet    Refill:  3    Order Specific Question:   Supervising Provider    Answer:   Quentin Angst [id number]  . metFORMIN (GLUCOPHAGE-XR) 500 MG 24 hr tablet    Sig: Take 1 tablet (500 mg total) by mouth daily with breakfast.    Dispense:  90 tablet    Refill:  3    Order Specific Question:   Supervising Provider    Answer:   Quentin Angst [id number]  . naproxen (NAPROSYN) 500 MG tablet    Sig: Take 1 tablet (500 mg total) by mouth 2 (two) times daily with a meal.    Dispense:  30 tablet    Refill:  0    Order Specific Question:   Supervising Provider    Answer:   Quentin Angst [id number]    Follow-up: Return in about 6 months (around 05/07/2017) for HLD.   Loletta Specter PA

## 2016-11-06 NOTE — Patient Instructions (Signed)
Prediabetes Prediabetes is the condition of having a blood sugar (blood glucose) level that is higher than it should be, but not high enough for you to be diagnosed with type 2 diabetes. Having prediabetes puts you at risk for developing type 2 diabetes (type 2 diabetes mellitus). Prediabetes may be called impaired glucose tolerance or impaired fasting glucose. Prediabetes usually does not cause symptoms. Your health care provider can diagnose this condition with blood tests. You may be tested for prediabetes if you are overweight and if you have at least one other risk factor for prediabetes. Risk factors for prediabetes include:  Having a family member with type 2 diabetes.  Being overweight or obese.  Being older than age 45.  Being of American-Indian, African-American, Hispanic/Latino, or Asian/Pacific Islander descent.  Having an inactive (sedentary) lifestyle.  Having a history of gestational diabetes or polycystic ovarian syndrome (PCOS).  Having low levels of good cholesterol (HDL-C) or high levels of blood fats (triglycerides).  Having high blood pressure.  What is blood glucose and how is blood glucose measured?  Blood glucose refers to the amount of glucose in your bloodstream. Glucose comes from eating foods that contain sugars and starches (carbohydrates) that the body breaks down into glucose. Your blood glucose level may be measured in mg/dL (milligrams per deciliter) or mmol/L (millimoles per liter).Your blood glucose may be checked with one or more of the following blood tests:  A fasting blood glucose (FBG) test. You will not be allowed to eat (you will fast) for at least 8 hours before a blood sample is taken. ? A normal range for FBG is 70-100 mg/dl (3.9-5.6 mmol/L).  An A1c (hemoglobin A1c) blood test. This test provides information about blood glucose control over the previous 2?3months.  An oral glucose tolerance test (OGTT). This test measures your blood  glucose twice: ? After fasting. This is your baseline level. ? Two hours after you drink a beverage that contains glucose.  You may be diagnosed with prediabetes:  If your FBG is 100?125 mg/dL (5.6-6.9 mmol/L).  If your A1c level is 5.7?6.4%.  If your OGGT result is 140?199 mg/dL (7.8-11 mmol/L).  These blood tests may be repeated to confirm your diagnosis. What happens if blood glucose is too high? The pancreas produces a hormone (insulin) that helps move glucose from the bloodstream into cells. When cells in the body do not respond properly to insulin that the body makes (insulin resistance), excess glucose builds up in the blood instead of going into cells. As a result, high blood glucose (hyperglycemia) can develop, which can cause many complications. This is a symptom of prediabetes. What can happen if blood glucose stays higher than normal for a long time? Having high blood glucose for a long time is dangerous. Too much glucose in your blood can damage your nerves and blood vessels. Long-term damage can lead to complications from diabetes, which may include:  Heart disease.  Stroke.  Blindness.  Kidney disease.  Depression.  Poor circulation in the feet and legs, which could lead to surgical removal (amputation) in severe cases.  How can prediabetes be prevented from turning into type 2 diabetes?  To help prevent type 2 diabetes, take the following actions:  Be physically active. ? Do moderate-intensity physical activity for at least 30 minutes on at least 5 days of the week, or as much as told by your health care provider. This could be brisk walking, biking, or water aerobics. ? Ask your health care provider what   activities are safe for you. A mix of physical activities may be best, such as walking, swimming, cycling, and strength training.  Lose weight as told by your health care provider. ? Losing 5-7% of your body weight can reverse insulin resistance. ? Your health  care provider can determine how much weight loss is best for you and can help you lose weight safely.  Follow a healthy meal plan. This includes eating lean proteins, complex carbohydrates, fresh fruits and vegetables, low-fat dairy products, and healthy fats. ? Follow instructions from your health care provider about eating or drinking restrictions. ? Make an appointment to see a diet and nutrition specialist (registered dietitian) to help you create a healthy eating plan that is right for you.  Do not smoke or use any tobacco products, such as cigarettes, chewing tobacco, and e-cigarettes. If you need help quitting, ask your health care provider.  Take over-the-counter and prescription medicines as told by your health care provider. You may be prescribed medicines that help lower the risk of type 2 diabetes.  This information is not intended to replace advice given to you by your health care provider. Make sure you discuss any questions you have with your health care provider. Document Released: 05/07/2015 Document Revised: 06/21/2015 Document Reviewed: 03/06/2015 [employer] Interactive Patient Education  2018 [employer]  

## 2017-01-02 ENCOUNTER — Ambulatory Visit: Payer: Self-pay | Attending: Internal Medicine

## 2017-04-22 IMAGING — US US ART/VEN ABD/PELV/SCROTUM DOPPLER LTD
1 series · 13 of 25 positions shown · non-contrast
Comparison: None.

CLINICAL DATA: 44-year-old male with right scrotal lump palpated
for 1 year. No acute symptoms.

EXAM:
SCROTAL ULTRASOUND
DOPPLER ULTRASOUND OF THE TESTICLES
TECHNIQUE: Complete ultrasound examination of the testicles, epididymis, and
other scrotal structures was performed. Color and spectral Doppler
ultrasound were also utilized to evaluate blood flow to the
testicles.

[Series 1: us art/ven abd/pelv/scrotum doppler ltd · 0.10mm/px · 13 of 54 slices shown]
[im 1/54]
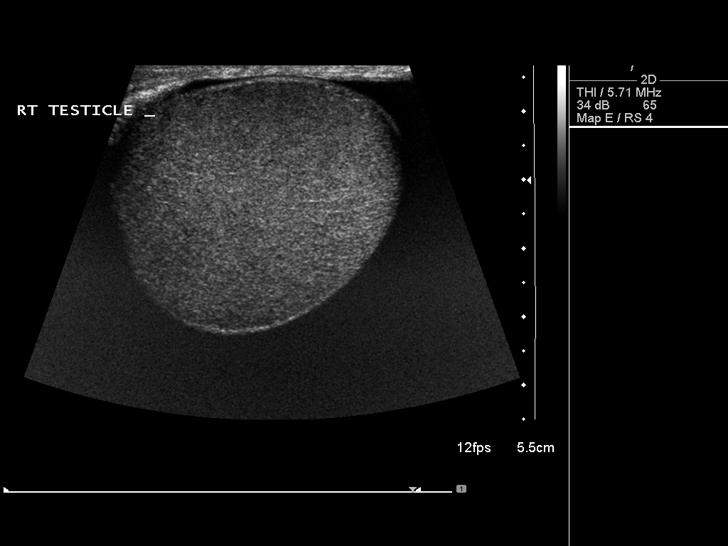
[im 5/54]
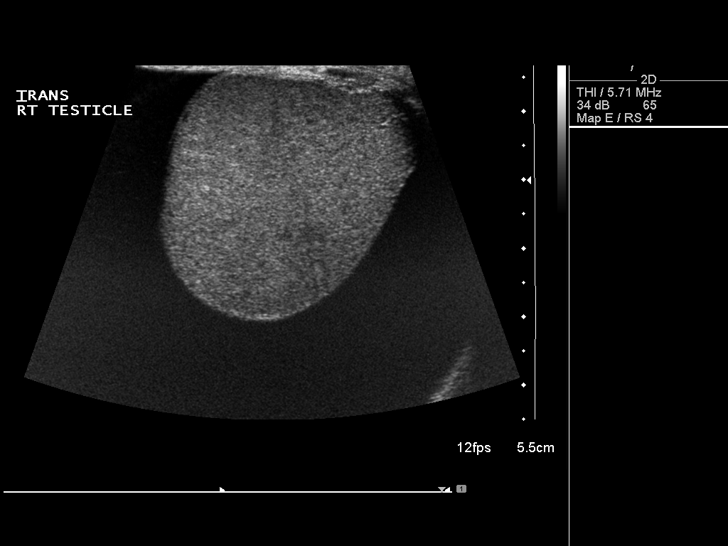
[im 9/54]
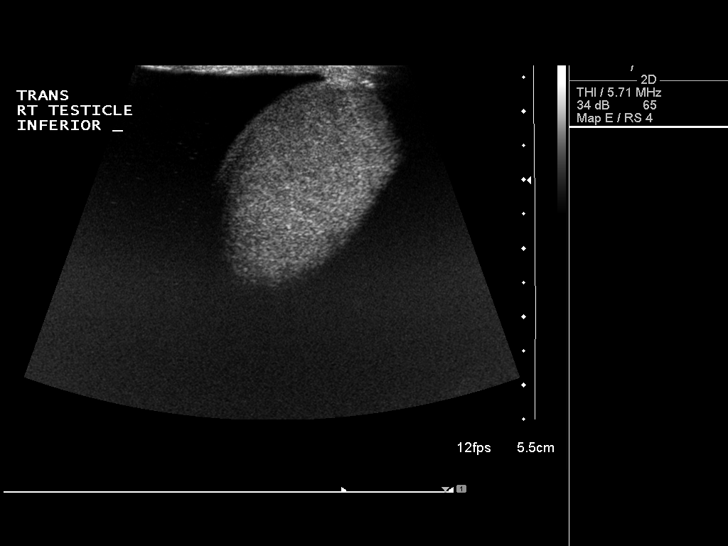
[im 14/54]
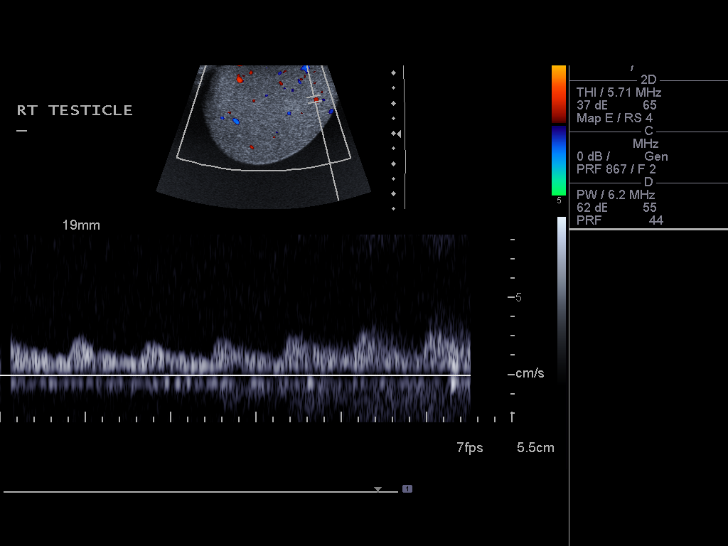
[im 18/54]
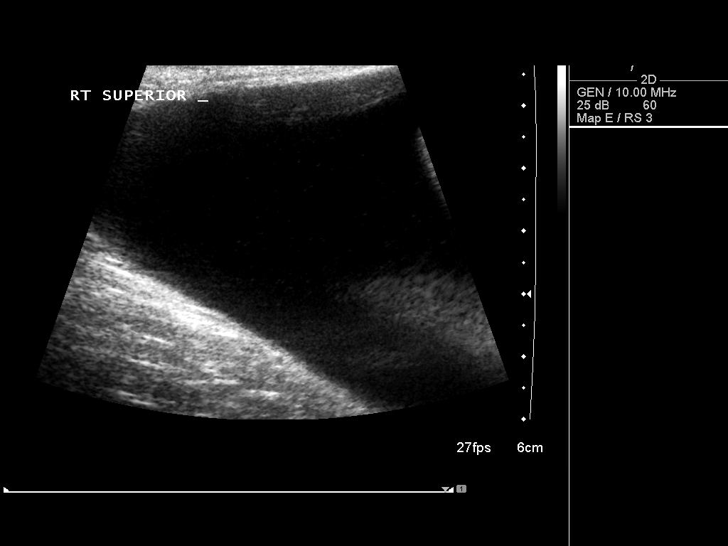
[im 23/54]
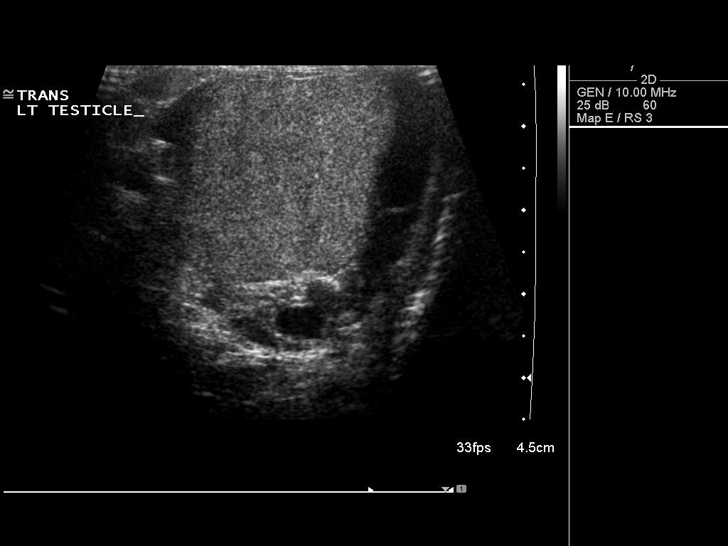
[im 27/54]
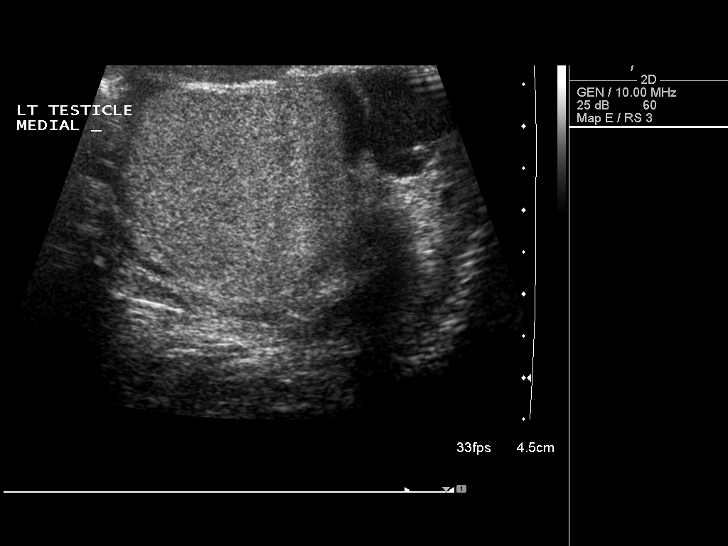
[im 31/54]
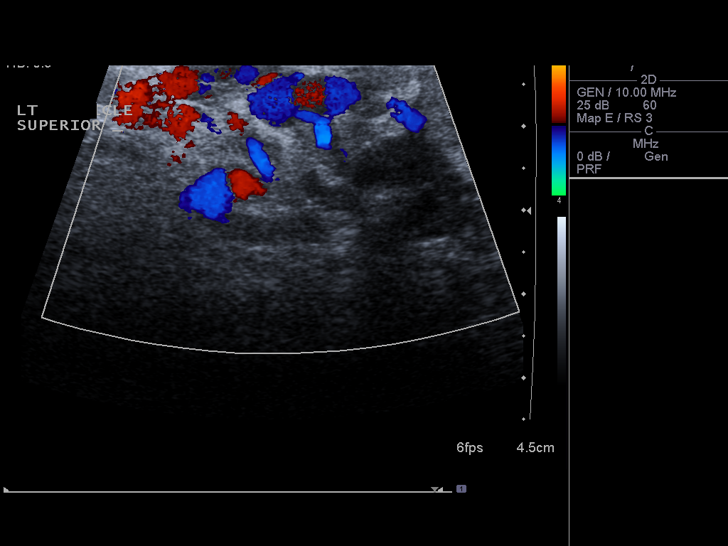
[im 36/54]
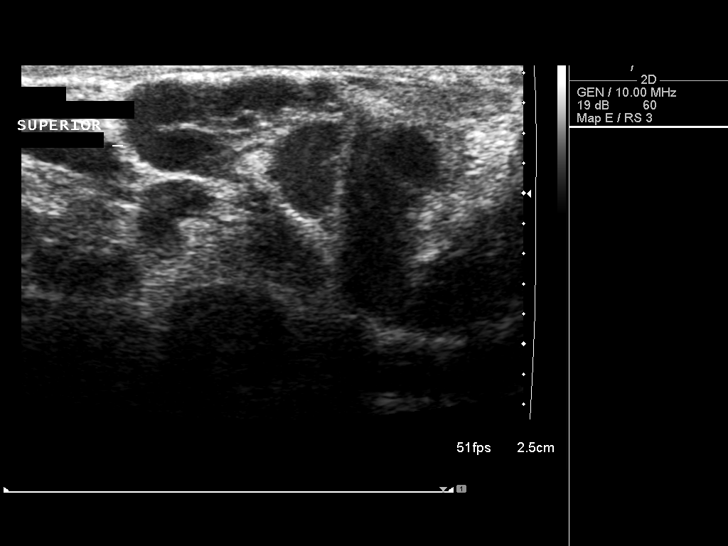
[im 40/54]
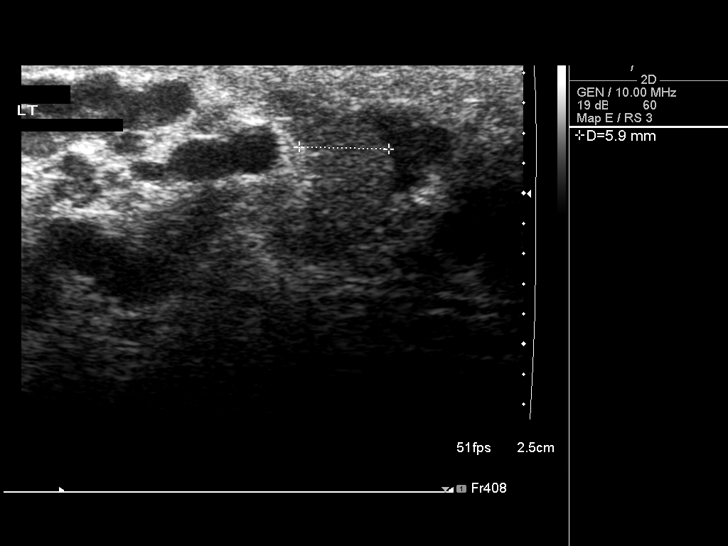
[im 45/54]
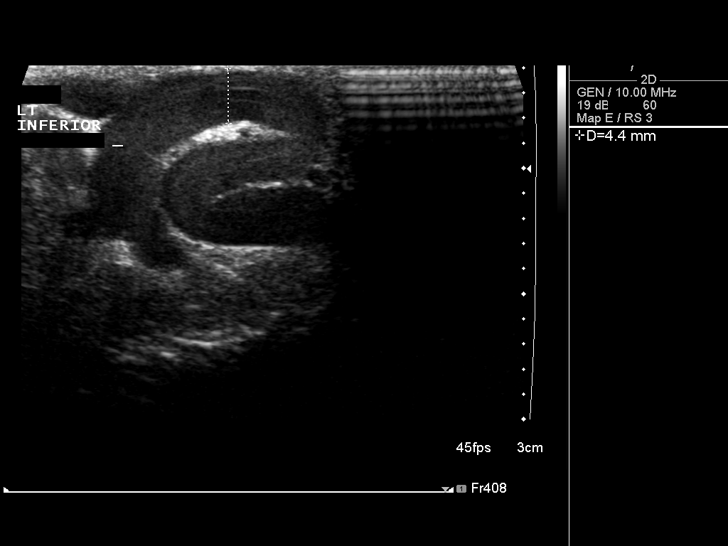
[im 49/54]
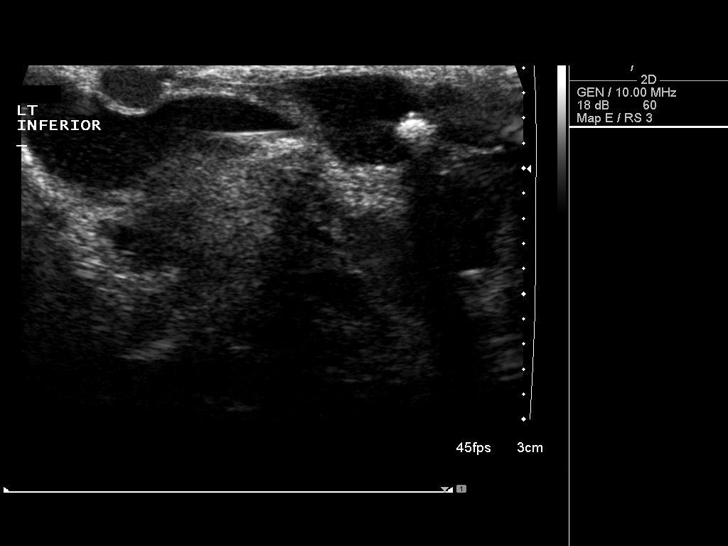
[im 54/54]
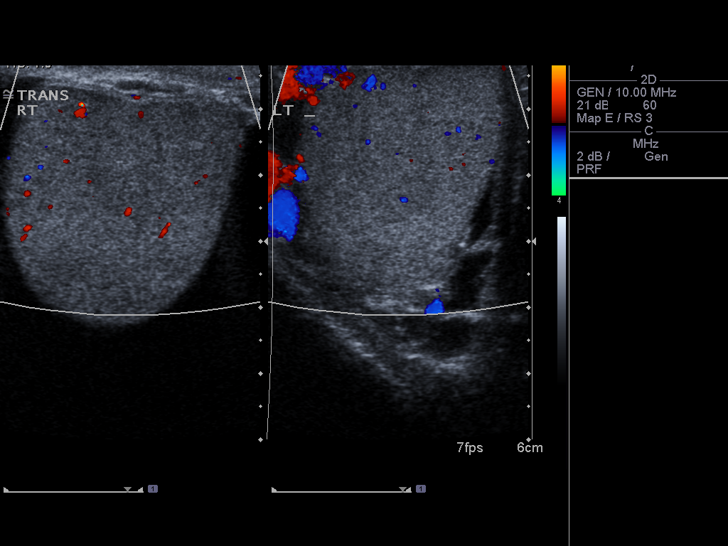

[13 of 25 positions shown; findings below may reference images not displayed]

FINDINGS: Right testicle

Measurements: 4.1 x 3.6 x 3.6 cm. No mass or microlithiasis
visualized.

Left testicle

Measurements: 4.0 x 3.1 x 3.0 cm. No mass or microlithiasis
visualized.

Right epididymis:  Not discretely visualized.

Left epididymis:  Normal in size and appearance.

Hydrocele: Large simple right hydrocele measuring 13.5 x 6.1 x
cm in maximum dimensions. No left hydrocele.

Varicocele: Small-to-moderate left varicocele with left scrotal
veins measuring up to 4 mm diameter. Tiny calcified 3 mm venous
phlebolith is seen within one of the dilated left inferior scrotal
veins. No right varicocele.

Pulsed Doppler interrogation of both testes demonstrates normal low
resistance arterial and venous waveforms bilaterally.
IMPRESSION: 1. Large simple right hydrocele.
2. Normal testes with no evidence of testicular torsion. No
testicular mass.
3. Small to moderate left varicocele.
